# Patient Record
Sex: Female | Born: 1980
Health system: Southern US, Community
[De-identification: ages and names within clinical notes are randomized; demographics above are authoritative.]

## PROBLEM LIST (undated history)

## (undated) DIAGNOSIS — N941 Unspecified dyspareunia: Secondary | ICD-10-CM

## (undated) DIAGNOSIS — D1720 Benign lipomatous neoplasm of skin and subcutaneous tissue of unspecified limb: Secondary | ICD-10-CM

## (undated) DIAGNOSIS — D179 Benign lipomatous neoplasm, unspecified: Secondary | ICD-10-CM

## (undated) DIAGNOSIS — Q513 Bicornate uterus: Secondary | ICD-10-CM

## (undated) HISTORY — DX: Unspecified dyspareunia: N94.10

## (undated) HISTORY — PX: NO PAST SURGERIES: SHX2092

## (undated) HISTORY — DX: Bicornate uterus: Q51.3

---

## 2010-10-27 ENCOUNTER — Emergency Department (HOSPITAL_COMMUNITY)
Admission: EM | Admit: 2010-10-27 | Discharge: 2010-10-27 | Payer: Self-pay | Source: Home / Self Care | Admitting: Emergency Medicine

## 2011-03-06 ENCOUNTER — Inpatient Hospital Stay (HOSPITAL_COMMUNITY)
Admission: AD | Admit: 2011-03-06 | Discharge: 2011-03-06 | Disposition: A | Discharge status: Home / Self Care | Payer: Medicaid Other | Source: Ambulatory Visit | Attending: Family Medicine | Admitting: Family Medicine

## 2011-03-06 ENCOUNTER — Inpatient Hospital Stay (HOSPITAL_COMMUNITY): Payer: Self-pay

## 2011-03-06 ENCOUNTER — Inpatient Hospital Stay (HOSPITAL_COMMUNITY): Payer: Medicaid Other

## 2011-03-06 DIAGNOSIS — R109 Unspecified abdominal pain: Secondary | ICD-10-CM

## 2011-03-06 DIAGNOSIS — O9989 Other specified diseases and conditions complicating pregnancy, childbirth and the puerperium: Secondary | ICD-10-CM

## 2011-03-06 DIAGNOSIS — R102 Pelvic and perineal pain: Secondary | ICD-10-CM

## 2011-03-06 DIAGNOSIS — O99891 Other specified diseases and conditions complicating pregnancy: Secondary | ICD-10-CM | POA: Insufficient documentation

## 2011-03-06 LAB — URINALYSIS, ROUTINE W REFLEX MICROSCOPIC
Bilirubin Urine: NEGATIVE
Glucose, UA: NEGATIVE mg/dL
Hgb urine dipstick: NEGATIVE
Ketones, ur: 15 mg/dL — AB
pH: 6 (ref 5.0–8.0)

## 2011-03-06 LAB — HCG, QUANTITATIVE, PREGNANCY: hCG, Beta Chain, Quant, S: 12864 m[IU]/mL — ABNORMAL HIGH (ref <5)

## 2011-03-06 LAB — CBC
Hemoglobin: 13 g/dL (ref 12.0–15.0)
MCHC: 34.2 g/dL (ref 30.0–36.0)
WBC: 11.3 10*3/uL — ABNORMAL HIGH (ref 4.0–10.5)

## 2011-03-06 LAB — WET PREP, GENITAL: Clue Cells Wet Prep HPF POC: NONE SEEN

## 2011-03-07 LAB — GC/CHLAMYDIA PROBE AMP, GENITAL
Chlamydia, DNA Probe: UNDETERMINED
GC Probe Amp, Genital: NEGATIVE

## 2011-03-10 ENCOUNTER — Other Ambulatory Visit: Payer: Self-pay | Admitting: Family Medicine

## 2011-03-10 DIAGNOSIS — O009 Unspecified ectopic pregnancy without intrauterine pregnancy: Secondary | ICD-10-CM

## 2011-03-13 ENCOUNTER — Inpatient Hospital Stay (HOSPITAL_COMMUNITY)
Admission: AD | Admit: 2011-03-13 | Discharge: 2011-03-13 | Disposition: A | Discharge status: Home / Self Care | Payer: Medicaid Other | Source: Ambulatory Visit | Attending: Obstetrics and Gynecology | Admitting: Obstetrics and Gynecology

## 2011-03-13 ENCOUNTER — Ambulatory Visit (HOSPITAL_COMMUNITY)
Admission: RE | Admit: 2011-03-13 | Discharge: 2011-03-13 | Disposition: A | Discharge status: Home / Self Care | Payer: Medicaid Other | Source: Ambulatory Visit | Attending: Family Medicine | Admitting: Family Medicine

## 2011-03-13 DIAGNOSIS — R109 Unspecified abdominal pain: Secondary | ICD-10-CM | POA: Insufficient documentation

## 2011-03-13 DIAGNOSIS — O209 Hemorrhage in early pregnancy, unspecified: Secondary | ICD-10-CM | POA: Insufficient documentation

## 2011-03-13 DIAGNOSIS — O009 Unspecified ectopic pregnancy without intrauterine pregnancy: Secondary | ICD-10-CM

## 2011-03-13 DIAGNOSIS — O99891 Other specified diseases and conditions complicating pregnancy: Secondary | ICD-10-CM | POA: Insufficient documentation

## 2011-03-31 ENCOUNTER — Inpatient Hospital Stay (HOSPITAL_COMMUNITY): Payer: Medicaid Other

## 2011-03-31 ENCOUNTER — Inpatient Hospital Stay (HOSPITAL_COMMUNITY)
Admission: AD | Admit: 2011-03-31 | Discharge: 2011-03-31 | Disposition: A | Discharge status: Home / Self Care | Payer: Medicaid Other | Source: Ambulatory Visit | Attending: Obstetrics and Gynecology | Admitting: Obstetrics and Gynecology

## 2011-03-31 DIAGNOSIS — O021 Missed abortion: Secondary | ICD-10-CM | POA: Insufficient documentation

## 2011-03-31 LAB — CBC
HCT: 38.4 % (ref 36.0–46.0)
Hemoglobin: 12.8 g/dL (ref 12.0–15.0)
MCH: 29.4 pg (ref 26.0–34.0)
MCHC: 33.3 g/dL (ref 30.0–36.0)

## 2011-03-31 LAB — URINALYSIS, ROUTINE W REFLEX MICROSCOPIC
Bilirubin Urine: NEGATIVE
Protein, ur: NEGATIVE mg/dL
Urobilinogen, UA: 0.2 mg/dL (ref 0.0–1.0)

## 2011-08-12 LAB — RUBELLA ANTIBODY, IGM: Rubella: IMMUNE

## 2011-08-12 LAB — HEPATITIS B SURFACE ANTIGEN: Hepatitis B Surface Ag: NEGATIVE

## 2011-08-12 LAB — GC/CHLAMYDIA PROBE AMP, GENITAL
Chlamydia: NEGATIVE
Gonorrhea: NEGATIVE

## 2011-08-12 LAB — ABO/RH: RH Type: POSITIVE

## 2011-10-11 ENCOUNTER — Emergency Department (HOSPITAL_COMMUNITY)
Admission: EM | Admit: 2011-10-11 | Discharge: 2011-10-11 | Disposition: A | Discharge status: Home / Self Care | Payer: Medicaid Other | Attending: Emergency Medicine | Admitting: Emergency Medicine

## 2011-10-11 DIAGNOSIS — R05 Cough: Secondary | ICD-10-CM | POA: Insufficient documentation

## 2011-10-11 DIAGNOSIS — J3489 Other specified disorders of nose and nasal sinuses: Secondary | ICD-10-CM | POA: Insufficient documentation

## 2011-10-11 DIAGNOSIS — H9209 Otalgia, unspecified ear: Secondary | ICD-10-CM | POA: Insufficient documentation

## 2011-10-11 DIAGNOSIS — R070 Pain in throat: Secondary | ICD-10-CM | POA: Insufficient documentation

## 2011-10-11 DIAGNOSIS — H6120 Impacted cerumen, unspecified ear: Secondary | ICD-10-CM | POA: Insufficient documentation

## 2011-10-11 DIAGNOSIS — R059 Cough, unspecified: Secondary | ICD-10-CM | POA: Insufficient documentation

## 2011-10-11 DIAGNOSIS — J069 Acute upper respiratory infection, unspecified: Secondary | ICD-10-CM | POA: Insufficient documentation

## 2011-10-11 DIAGNOSIS — R51 Headache: Secondary | ICD-10-CM | POA: Insufficient documentation

## 2012-01-26 LAB — STREP B DNA PROBE: GBS: NEGATIVE

## 2012-02-17 ENCOUNTER — Telehealth (HOSPITAL_COMMUNITY): Payer: Self-pay | Admitting: *Deleted

## 2012-02-17 ENCOUNTER — Encounter (HOSPITAL_COMMUNITY): Payer: Self-pay | Admitting: *Deleted

## 2012-02-17 NOTE — Telephone Encounter (Signed)
Preadmission screen  

## 2012-02-20 ENCOUNTER — Inpatient Hospital Stay (HOSPITAL_COMMUNITY): Admission: RE | Admit: 2012-02-20 | Payer: Medicaid Other | Source: Ambulatory Visit

## 2012-02-24 ENCOUNTER — Inpatient Hospital Stay (HOSPITAL_COMMUNITY): Payer: Medicaid Other | Admitting: Anesthesiology

## 2012-02-24 ENCOUNTER — Encounter (HOSPITAL_COMMUNITY): Payer: Self-pay

## 2012-02-24 ENCOUNTER — Encounter (HOSPITAL_COMMUNITY): Payer: Self-pay | Admitting: Anesthesiology

## 2012-02-24 ENCOUNTER — Inpatient Hospital Stay (HOSPITAL_COMMUNITY)
Admission: RE | Admit: 2012-02-24 | Discharge: 2012-02-26 | DRG: 774 | Disposition: A | Discharge status: Hospice | Payer: Medicaid Other | Source: Ambulatory Visit | Attending: Obstetrics and Gynecology | Admitting: Obstetrics and Gynecology

## 2012-02-24 LAB — CBC
Hemoglobin: 13.1 g/dL (ref 12.0–15.0)
MCH: 29.9 pg (ref 26.0–34.0)
MCV: 89 fL (ref 78.0–100.0)
RBC: 4.38 MIL/uL (ref 3.87–5.11)
WBC: 12.7 10*3/uL — ABNORMAL HIGH (ref 4.0–10.5)

## 2012-02-24 MED ORDER — LACTATED RINGERS IV SOLN
INTRAVENOUS | Status: DC
  Administered 2012-02-24 (×4): via INTRAVENOUS

## 2012-02-24 MED ORDER — TERBUTALINE SULFATE 1 MG/ML IJ SOLN: 0.2500 mg | Freq: Once | INTRAMUSCULAR | Status: AC | PRN

## 2012-02-24 MED ORDER — OXYTOCIN 20 UNITS IN LACTATED RINGERS INFUSION - SIMPLE
1.0000 m[IU]/min | INTRAVENOUS | Status: DC
  Administered 2012-02-24: 1 m[IU]/min via INTRAVENOUS
  Filled 2012-02-24: qty 1000

## 2012-02-24 MED ORDER — OXYCODONE-ACETAMINOPHEN 5-325 MG PO TABS
1.0000 | ORAL_TABLET | ORAL | Status: DC | PRN
  Administered 2012-02-25: 1 via ORAL
  Filled 2012-02-24: qty 1

## 2012-02-24 MED ORDER — PHENYLEPHRINE 40 MCG/ML (10ML) SYRINGE FOR IV PUSH (FOR BLOOD PRESSURE SUPPORT): 80.0000 ug | PREFILLED_SYRINGE | INTRAVENOUS | Status: DC | PRN

## 2012-02-24 MED ORDER — OXYTOCIN BOLUS FROM INFUSION
500.0000 mL | Freq: Once | INTRAVENOUS | Status: DC
  Filled 2012-02-24: qty 500
  Filled 2012-02-24: qty 1000

## 2012-02-24 MED ORDER — LIDOCAINE HCL (PF) 1 % IJ SOLN
INTRAMUSCULAR | Status: DC | PRN
  Administered 2012-02-24 (×2): 5 mL

## 2012-02-24 MED ORDER — LACTATED RINGERS IV SOLN
500.0000 mL | INTRAVENOUS | Status: DC | PRN
  Administered 2012-02-24 (×2): 500 mL via INTRAVENOUS

## 2012-02-24 MED ORDER — FENTANYL 2.5 MCG/ML BUPIVACAINE 1/10 % EPIDURAL INFUSION (WH - ANES)
14.0000 mL/h | INTRAMUSCULAR | Status: DC
  Administered 2012-02-24 (×2): 14 mL/h via EPIDURAL
  Filled 2012-02-24 (×2): qty 60

## 2012-02-24 MED ORDER — EPHEDRINE 5 MG/ML INJ
10.0000 mg | INTRAVENOUS | Status: DC | PRN
  Filled 2012-02-24: qty 4

## 2012-02-24 MED ORDER — DIPHENHYDRAMINE HCL 50 MG/ML IJ SOLN: 12.5000 mg | INTRAMUSCULAR | Status: DC | PRN

## 2012-02-24 MED ORDER — EPHEDRINE 5 MG/ML INJ: 10.0000 mg | INTRAVENOUS | Status: DC | PRN

## 2012-02-24 MED ORDER — LACTATED RINGERS IV SOLN
500.0000 mL | Freq: Once | INTRAVENOUS | Status: AC
  Administered 2012-02-24: 500 mL via INTRAVENOUS

## 2012-02-24 MED ORDER — LIDOCAINE HCL (PF) 1 % IJ SOLN
30.0000 mL | INTRAMUSCULAR | Status: DC | PRN
  Administered 2012-02-24: 30 mL via SUBCUTANEOUS
  Filled 2012-02-24: qty 30

## 2012-02-24 MED ORDER — IBUPROFEN 600 MG PO TABS
600.0000 mg | ORAL_TABLET | Freq: Four times a day (QID) | ORAL | Status: DC | PRN
  Administered 2012-02-25: 600 mg via ORAL
  Filled 2012-02-24: qty 1

## 2012-02-24 MED ORDER — PHENYLEPHRINE 40 MCG/ML (10ML) SYRINGE FOR IV PUSH (FOR BLOOD PRESSURE SUPPORT)
80.0000 ug | PREFILLED_SYRINGE | INTRAVENOUS | Status: DC | PRN
  Filled 2012-02-24: qty 5

## 2012-02-24 MED ORDER — OXYTOCIN 20 UNITS IN LACTATED RINGERS INFUSION - SIMPLE: 125.0000 mL/h | Freq: Once | INTRAVENOUS | Status: DC

## 2012-02-24 NOTE — Progress Notes (Signed)
RN called Dr. Ambrose Mantle, reported 2 late decels, and interventions that had taken place (turned pt to left side, 500cc bolus, and 10L oxygen via non-rebreather. RN also told Dr. Ambrose Mantle that contractions were every 3-5 minutes. Dr. Ambrose Mantle instructed to continue with pitocin, and continue changing position of patient, to maintain reassuring fetal pattern.

## 2012-02-24 NOTE — Anesthesia Preprocedure Evaluation (Signed)

## 2012-02-24 NOTE — Progress Notes (Signed)
Patient ID: Haley Frazier, female   DOB: 1981-02-19, 31 y.o.   MRN: [id number] Pt is having more bloody show and the cervix is 10 cm 100% effaced and the vertex is at +1 station. We will have her begin pushing.

## 2012-02-24 NOTE — H&P (Signed)
Haley Frazier, [place]                 ACCOUNT NO.:  [id number]  MEDICAL RECORD NO.:  [id number]  LOCATION:  9173                          FACILITY:  [facility]  PHYSICIAN:  Malachi Pro. Ambrose Mantle, M.D. DATE OF BIRTH:  [birth year]  DATE OF ADMISSION:  02/24/2012 DATE OF DISCHARGE:                             HISTORY & PHYSICAL   PRESENT ILLNESS:  This is a 31 year old Asian female, para 0-0-1-0, gravida 2, EDC February 18, 2012, by an ultrasound at 10 weeks and 0 days done on July 23, 2011, is admitted for her induction of labor.  LABORATORY DATA:  Blood group and type O positive with a negative antibody, rubella immune, RPR nonreactive.  Urine culture negative. Hepatitis B surface antigen negative.  HIV negative.  First trimester screening normal.  GC and Chlamydia negative.  Cystic fibrosis negative. AFP negative.  One hour Glucola 135, 3-hour GTT 101, 116, 113, and 106. Repeat HIV and RPR negative.  Group B strep negative.  This patient began her prenatal course with early ultrasound at 10 weeks and corrected her EDC.  She is having uncomplicated prenatal course. However, she has gone 6 days past her due date without going into labor. Her cervix is reasonably favorable, and she is admitted for induction of labor.  PAST MEDICAL HISTORY:  No known.  ALLERGIES:  To drugs, no latex allergy.  She has had no medical illnesses and no surgical procedures.  FAMILY HISTORY:  Mother has high blood pressure.  The patient did have a spontaneous abortion in 2012.  She does not drink, smoke, or take illicit drugs.  She is single but in a relationship.  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  PHYSICAL EXAMINATION:  VITAL SIGNS:  On admission, blood pressure 119/82, temperature 97.6, respirations 16, pulse is 80. HEAD, EYES, NOSE, AND THROAT:  Normal. HEART:  Normal size and sounds.  No murmurs. LUNGS:  Clear to auscultation. ABDOMEN:  Soft.  Fundal height at the office on February 23, 2012, was  37 cm.  Fetal heart tones are normal.  In the office on February 23, 2012, nonstress test was nonreactive and a biophysical profile was 8/8.  The cervix on February 23, 2012, was 2 cm, 50% effaced, vertex was at a -2 station.  ADMITTING IMPRESSION:  Intrauterine pregnancy at 40 weeks and 6 days. She is admitted for Pitocin induction of labor.     Malachi Pro. Ambrose Mantle, M.D.     TFH/MEDQ  D:  02/24/2012  T:  02/24/2012  Job:  [id number]

## 2012-02-24 NOTE — Progress Notes (Signed)
Patient ID: Haley Frazier, female   DOB: 10-04-1981, 31 y.o.   MRN: [id number] Pt reached 4 cm and received an epidural and at 4:15 was 8 cm and now per the RN exam she is 9 cm 100% effaced and the vertex is at -1 station.

## 2012-02-24 NOTE — Anesthesia Procedure Notes (Signed)
Epidural Patient location during procedure: OB Start time: 02/24/2012 3:26 PM  Staffing Anesthesiologist: Brayton Caves R Performed by: anesthesiologist   Preanesthetic Checklist Completed: patient identified, site marked, surgical consent, pre-op evaluation, timeout performed, IV checked, risks and benefits discussed and monitors and equipment checked  Epidural Patient position: sitting Prep: site prepped and draped and DuraPrep Patient monitoring: continuous pulse ox and blood pressure Approach: midline Injection technique: LOR air and LOR saline  Needle:  Needle type: Tuohy  Needle gauge: 17 G Needle length: 9 cm Needle insertion depth: 4 cm Catheter type: closed end flexible Catheter size: 19 Gauge Catheter at skin depth: 10 cm Test dose: negative  Assessment Events: blood not aspirated, injection not painful, no injection resistance, negative IV test and no paresthesia  Additional Notes Patient identified.  Risk benefits discussed including failed block, incomplete pain control, headache, nerve damage, paralysis, blood pressure changes, nausea, vomiting, reactions to medication both toxic or allergic, and postpartum back pain.  Patient expressed understanding and wished to proceed.  All questions were answered.  Sterile technique used throughout procedure and epidural site dressed with sterile barrier dressing. No paresthesia or other complications noted.The patient did not experience any signs of intravascular injection such as tinnitus or metallic taste in mouth nor signs of intrathecal spread such as rapid motor block. Please see nursing notes for vital signs.

## 2012-02-24 NOTE — Progress Notes (Signed)
Dr. Ambrose Mantle reviewed fetal heart rate strip at this time, and stated that variability was minimal yesterday in the office.

## 2012-02-24 NOTE — Progress Notes (Signed)
RN called Ambrose Mantle, MD and updated him on status, UC's every 2-32min, accels, early decels, and cervix 8cm dilated with 100% effacement, -1 station.

## 2012-02-24 NOTE — Progress Notes (Signed)
Patient ID: Haley Frazier, female   DOB: 1980/12/24, 31 y.o.   MRN: [id number] Delivery note:   I prepped the pt for delivery and injected 12 cc's of 1% xylocaine for  a LML episiotomy. With 1 push the pt made no more descent so a Mity Vac bell cup vacuum was placed and with 1 contraction and no pop off's a living female infant 7 pounds 6 ounces was delivered LOA. Apgars were 9 and 9 at 1 and 5 minutes. The placenta delivered intact and the uterus was normal but boggy so manual massage was done to restore tone. The LML episiotomy was repaired with 2-0 and 3-0 vicryl. Rectal before the repair confirmed no rectal injury. EBL 600 cc's.

## 2012-02-24 NOTE — Progress Notes (Signed)
Patient ID: Haley Frazier, female   DOB: [birth year], 31 y.o.   MRN: [id number] Pitocin at 9 mu/ minute and contractions q 2-3 minutes but pt has no pain with the contractions. AROM produced clear fluid.

## 2012-02-24 NOTE — Progress Notes (Signed)
Patient ID: Haley Frazier, female   DOB: 06/27/1981, 31 y.o.   MRN: [id number] Pitocin at 11 mu/ minute and the contractions are q 2-3 minutes. The cervix is 3 cm 70 % effaced and the vertex is still at -2 station.

## 2012-02-24 NOTE — Progress Notes (Signed)
Patient ID: Haley Frazier, female   DOB: [birth year], 31 y.o.   MRN: [id number] The pt became fully dilated at 7 :30 PM but because of extra duties by the RN the pt did not begin pushing until 8:25 PM. She has pushed well but her first push is always her best push and she seems to fatigue easily. There is significant caput showing so I advised her that at 11 PM we should try to get the baby delivered by an episiotomy and if not effective a vacuum. Her temp is 101.0 She and her husband agree.

## 2012-02-25 LAB — DIFFERENTIAL
Eosinophils Absolute: 0.1 10*3/uL (ref 0.0–0.7)
Eosinophils Relative: 0 % (ref 0–5)
Lymphs Abs: 1.1 10*3/uL (ref 0.7–4.0)
Monocytes Relative: 6 % (ref 3–12)

## 2012-02-25 LAB — CBC
MCH: 30.1 pg (ref 26.0–34.0)
MCV: 89.5 fL (ref 78.0–100.0)
Platelets: 171 10*3/uL (ref 150–400)
RBC: 3.42 MIL/uL — ABNORMAL LOW (ref 3.87–5.11)

## 2012-02-25 MED ORDER — SENNOSIDES-DOCUSATE SODIUM 8.6-50 MG PO TABS
2.0000 | ORAL_TABLET | Freq: Every day | ORAL | Status: DC
  Administered 2012-02-25: 2 via ORAL

## 2012-02-25 MED ORDER — IBUPROFEN 600 MG PO TABS
600.0000 mg | ORAL_TABLET | Freq: Four times a day (QID) | ORAL | Status: DC
  Administered 2012-02-25 – 2012-02-26 (×5): 600 mg via ORAL
  Filled 2012-02-25 (×5): qty 1

## 2012-02-25 MED ORDER — DIPHENHYDRAMINE HCL 25 MG PO CAPS: 25.0000 mg | ORAL_CAPSULE | Freq: Four times a day (QID) | ORAL | Status: DC | PRN

## 2012-02-25 MED ORDER — PRENATAL MULTIVITAMIN CH: 1.0000 | ORAL_TABLET | Freq: Every day | ORAL | Status: DC

## 2012-02-25 MED ORDER — BENZOCAINE-MENTHOL 20-0.5 % EX AERO: 1.0000 "application " | INHALATION_SPRAY | CUTANEOUS | Status: DC | PRN

## 2012-02-25 MED ORDER — OXYTOCIN 20 UNITS IN LACTATED RINGERS INFUSION - SIMPLE: 125.0000 mL/h | INTRAVENOUS | Status: AC

## 2012-02-25 MED ORDER — BENZOCAINE-MENTHOL 20-0.5 % EX AERO
INHALATION_SPRAY | CUTANEOUS | Status: AC
  Filled 2012-02-25: qty 56

## 2012-02-25 MED ORDER — ZOLPIDEM TARTRATE 5 MG PO TABS: 5.0000 mg | ORAL_TABLET | Freq: Every evening | ORAL | Status: DC | PRN

## 2012-02-25 MED ORDER — PRENATAL MULTIVITAMIN CH
1.0000 | ORAL_TABLET | Freq: Every day | ORAL | Status: DC
  Administered 2012-02-25 – 2012-02-26 (×2): 1 via ORAL
  Filled 2012-02-25 (×2): qty 1

## 2012-02-25 MED ORDER — ONDANSETRON HCL 4 MG PO TABS: 4.0000 mg | ORAL_TABLET | ORAL | Status: DC | PRN

## 2012-02-25 MED ORDER — ONDANSETRON HCL 4 MG/2ML IJ SOLN
4.0000 mg | INTRAMUSCULAR | Status: DC | PRN
  Administered 2012-02-25: 4 mg via INTRAVENOUS
  Filled 2012-02-25: qty 2

## 2012-02-25 MED ORDER — DIBUCAINE 1 % RE OINT: 1.0000 "application " | TOPICAL_OINTMENT | RECTAL | Status: DC | PRN

## 2012-02-25 MED ORDER — TETANUS-DIPHTH-ACELL PERTUSSIS 5-2.5-18.5 LF-MCG/0.5 IM SUSP
0.5000 mL | Freq: Once | INTRAMUSCULAR | Status: AC
  Administered 2012-02-25: 0.5 mL via INTRAMUSCULAR
  Filled 2012-02-25: qty 0.5

## 2012-02-25 MED ORDER — WITCH HAZEL-GLYCERIN EX PADS: 1.0000 "application " | MEDICATED_PAD | CUTANEOUS | Status: DC | PRN

## 2012-02-25 MED ORDER — SIMETHICONE 80 MG PO CHEW: 80.0000 mg | CHEWABLE_TABLET | ORAL | Status: DC | PRN

## 2012-02-25 MED ORDER — MEASLES, MUMPS & RUBELLA VAC ~~LOC~~ INJ
0.5000 mL | INJECTION | Freq: Once | SUBCUTANEOUS | Status: DC
  Filled 2012-02-25: qty 0.5

## 2012-02-25 MED ORDER — LANOLIN HYDROUS EX OINT: TOPICAL_OINTMENT | CUTANEOUS | Status: DC | PRN

## 2012-02-25 MED ORDER — OXYCODONE-ACETAMINOPHEN 5-325 MG PO TABS
1.0000 | ORAL_TABLET | ORAL | Status: DC | PRN
  Administered 2012-02-26: 1 via ORAL
  Filled 2012-02-25: qty 1

## 2012-02-25 NOTE — Progress Notes (Signed)
UR chart review completed.  

## 2012-02-25 NOTE — Progress Notes (Signed)
Patient ID: Haley Frazier, female   DOB: June 04, 1981, 31 y.o.   MRN: [id number] #1 afebrile this AM Had temp to 101 prior to delivery. No foreign body in the vagina.

## 2012-02-25 NOTE — Anesthesia Postprocedure Evaluation (Signed)
  Anesthesia Post-op Note  Patient: Haley Frazier  Procedure(s) Performed: * No procedures listed *  Patient Location: Mother/Baby  Anesthesia Type: Epidural  Level of Consciousness: awake, alert  and oriented  Airway and Oxygen Therapy: Patient Spontanous Breathing  Post-op Pain: mild  Post-op Assessment: Patient's Cardiovascular Status Stable and Respiratory Function Stable  Post-op Vital Signs: stable  Complications: No apparent anesthesia complications

## 2012-02-25 NOTE — Procedures (Signed)
Dr. Ambrose Frazier here for brief post-partum vag. Exam. Pt. Prepped and positioned. Tolerated procedure well

## 2012-02-26 MED ORDER — IBUPROFEN 600 MG PO TABS: 600.0000 mg | ORAL_TABLET | Freq: Four times a day (QID) | ORAL | Status: AC | PRN

## 2012-02-26 MED ORDER — OXYCODONE-ACETAMINOPHEN 5-325 MG PO TABS: 1.0000 | ORAL_TABLET | Freq: Four times a day (QID) | ORAL | Status: AC | PRN

## 2012-02-26 NOTE — Discharge Summary (Signed)
Haley Frazier, Haley Frazier                 ACCOUNT NO.:  [id number]  MEDICAL RECORD NO.:  [id number]  LOCATION:  9113                          FACILITY:  [facility]  PHYSICIAN:  Malachi Pro. Ambrose Mantle, M.D. DATE OF BIRTH:  07/11/1981  DATE OF ADMISSION:  02/24/2012 DATE OF DISCHARGE:  02/26/2012                              DISCHARGE SUMMARY   HISTORY OF PRESENT ILLNESS:  This is a 31 year old Asian female, para 0- 0-1-0, gravida 2, EDC February 18, 2012, by ultrasound at 10 weeks and 0 days, done on July 23, 2011, admitted for induction of labor.  Blood group and type O positive.  Negative antibody.  Rubella immune.  RPR nonreactive. Urine culture negative.  Hepatitis B surface antigen negative.  HIV negative.  First trimester screen normal.  GC and Chlamydia negative.  Cystic fibrosis negative.  AFP negative.  One hour Glucola 135, 3 hour GTT 101, 116, 113, and 106.  Repeat HIV and RPR negative.  Group B strep negative.  The patient was admitted and placed on Pitocin by 12:28 p.m.  On the day of admission, her Pitocin was at 9 milliunits a minute, contractions every 2-3 minutes, but there was no pain.  Artificial rupture of the membranes produced clear fluid.  By 1:19 p.m., the Pitocin was 11 milliunits a minute.  Contractions 2-3 minutes apart.  Cervix 3 cm, 70% vertex still at -2.  The patient was thought to reach 4 cm by the nurse and received an epidural.  At 4:15 p.m., she was 8 cm dilated and at 5:43 p.m. 9 cm.  The patient was noted to be fully dilated at 7:30 p.m. but because of extra duties by the RN, she did not began pushing until 8:25 p.m.  During pushing, her first push was always the best.  I advised the patient at  10:49 p.m. that at 11:00 p.m. we should try to get the baby delivered.  She developed a temperature elevation to 101 degrees.  I placed Xylocaine in the area for left mediolateral episiotomy, observed the patient pushed through 1 contraction, there was no more descent, there  was already some kaput showing and a Mityvac Bell cup vacuum was placed and with 1 contraction and no pop offs, a living female infant 7 pounds 6 ounces was delivered LOA.  Apgars were 9 and 9 at 1 and 5 minutes.  Placenta was intact. Uterus was boggy.  Massage and Pitocin were used.  Left mediolateral episiotomy repaired with 2-0 and 3-0 Vicryl.  Rectal done before the repair confirmed no rectal injury.  Blood loss about 600 mL. Postpartum, the patient did well.  She never became febrile postpartum and on the second postpartum day was ready for discharge.  LABORATORY DATA:  Initial hemoglobin of 13.1, hematocrit 39, white count 12,700, platelet count 227,000.  Followup hemoglobin 10.3, hematocrit 30.6.  FINAL DIAGNOSES:  Intrauterine pregnancy at 41 weeks, delivered vertex, prolonged second stage labor.  OPERATION:  Vacuum-assisted vaginal delivery.  Left mediolateral episiotomy and repair.  FINAL CONDITION:  Improved.  INSTRUCTIONS:  Our regular discharge instruction booklet.  The patient is advised to return to the office in 6 weeks for followup examination. They  are going to have the baby circumcised at an outside office, Percocet 5/325, and Motrin 600 mg, 30 of each 1 every 6 hours as needed for pain given at discharge.     Malachi Pro. Ambrose Mantle, M.D.     TFH/MEDQ  D:  02/26/2012  T:  02/26/2012  Job:  [id number]

## 2012-02-26 NOTE — Discharge Instructions (Signed)
booklet °

## 2012-02-26 NOTE — Discharge Summary (Signed)
#  2 afebrile no problems for d/c.

## 2012-03-15 ENCOUNTER — Emergency Department (INDEPENDENT_AMBULATORY_CARE_PROVIDER_SITE_OTHER)
Admission: EM | Admit: 2012-03-15 | Discharge: 2012-03-15 | Disposition: A | Discharge status: Home / Self Care | Payer: Medicaid Other | Source: Home / Self Care | Attending: Family Medicine | Admitting: Family Medicine

## 2012-03-15 ENCOUNTER — Encounter (HOSPITAL_COMMUNITY): Payer: Self-pay

## 2012-03-15 DIAGNOSIS — R1033 Periumbilical pain: Secondary | ICD-10-CM

## 2012-03-15 MED ORDER — IBUPROFEN 600 MG PO TABS: 600.0000 mg | ORAL_TABLET | Freq: Three times a day (TID) | ORAL | Status: AC | PRN

## 2012-03-15 MED ORDER — HYDROCODONE-ACETAMINOPHEN 5-500 MG PO TABS: 1.0000 | ORAL_TABLET | Freq: Four times a day (QID) | ORAL | Status: AC | PRN

## 2012-03-15 NOTE — ED Provider Notes (Signed)
History     CSN: [id number]  Arrival date & time 03/15/12  1904   First MD Initiated Contact with Patient 03/15/12 1921      Chief Complaint  Patient presents with  . Abdominal Pain    (Consider location/radiation/quality/duration/timing/severity/associated sxs/prior treatment) HPI Comments: 31 y/o female 3 weeks post partum by Vacuum assisted VD at >40 weeks during induction. Here c/o intermittent periumbilical abdominal pain. Pain comes and goes daily since her discharge from delivery. Worse during breast feeding. Taking ibuprofen sporadically which helps. No irradiation of pain. Does no relate pain with food intake. Denies nausea, vomiting or diarrhea, denies fever or chills. Denies dysuria. Reports still minimal vaginal spotting. Denies vaginal discharge or bad odor. Last bowel movement in am formed stools, no straining. Good appetite. No jaundice, no headache or dizziness. Has not taken any medication today.   Past Medical History  Diagnosis Date  . No pertinent past medical history     Past Surgical History  Procedure Date  . No past surgeries     Family History  Problem Relation Age of Onset  . Hypertension Mother     History  Substance Use Topics  . Smoking status: Never Smoker   . Smokeless tobacco: Never Used  . Alcohol Use: No    OB History    Grav Para Term Preterm Abortions TAB SAB Ect Mult Living   2 1 1  1  1   1       Review of Systems  Constitutional: Negative for fever, chills, appetite change and fatigue.  HENT: Negative for congestion, sore throat and trouble swallowing.   Respiratory: Negative for cough and shortness of breath.   Cardiovascular: Negative for leg swelling.  Gastrointestinal: Positive for abdominal pain. Negative for nausea, vomiting, diarrhea, constipation, blood in stool, abdominal distention and rectal pain.  Genitourinary: Negative for dysuria, frequency, flank pain, vaginal discharge and pelvic pain.  Musculoskeletal:  Negative for back pain.  Skin: Negative for rash.  Neurological: Negative for dizziness and headaches.    Allergies  Review of patient's allergies indicates no known allergies.  Home Medications   Current Outpatient Rx  Name Route Sig Dispense Refill  . PRENATAL MULTIVITAMIN CH Oral Take 1 tablet by mouth daily.    Marland Kitchen HYDROCODONE-ACETAMINOPHEN 5-500 MG PO TABS Oral Take 1-2 tablets by mouth every 6 (six) hours as needed for pain. 15 tablet 0  . IBUPROFEN 600 MG PO TABS Oral Take 1 tablet (600 mg total) by mouth every 8 (eight) hours as needed for pain. 20 tablet 0    BP 117/78  Pulse 85  Temp(Src) 98.6 F (37 C) (Oral)  Resp 18  SpO2 99%  LMP 04/22/2011  Physical Exam  Nursing note and vitals reviewed. Constitutional: She is oriented to person, place, and time. She appears well-developed and well-nourished. No distress.       Appears comfortable  HENT:  Head: Normocephalic and atraumatic.  Mouth/Throat: Oropharynx is clear and moist. No oropharyngeal exudate.  Eyes: Conjunctivae are normal. Pupils are equal, round, and reactive to light. No scleral icterus.  Neck: Normal range of motion. Neck supple. No thyromegaly present.  Cardiovascular: Normal rate, regular rhythm and normal heart sounds.        No LEE.  Pulmonary/Chest: Breath sounds normal.  Abdominal: Soft. Bowel sounds are normal. She exhibits no distension and no mass. There is no rebound and no guarding.       No hepatosplenomegaly. No CVT  Mild tenderness to deep palpation  in left periumbilical area, uterus fundus below synphysis. No rebound or guarding.  Lymphadenopathy:    She has no cervical adenopathy.  Neurological: She is alert and oriented to person, place, and time.  Skin: Skin is warm. No rash noted.    ED Course  Procedures (including critical care time)  Labs Reviewed - No data to display No results found.   1. Abdominal pain, periumbilical       MDM  Reassuring abdominal exam. No  dyspepsia.  Impress uterine cramping. Prescribed Vicodin, ibuprofen, stool softner. Asked to follow up with GYN as scheduled or go to ED if worsening or new symptoms like fever, chills, nausea or vomiting.         Sharin Grave, MD 03/16/12 1116

## 2012-03-15 NOTE — Discharge Instructions (Signed)
Take the prescribed medications as instructed. Be aware that Vicodin can cause drowsiness and should not be taking before driving it can also cause constipation. You can take MiraLax over-the-counter one cup full mix with water or Gatorade while taking Vicodin for pain to avoid constipation. [facility] persistent or worsening pain or new symptoms like nausea, vomiting, burning or urination, increased vaginal bleeding or discharge or fever.

## 2012-03-15 NOTE — ED Notes (Signed)
C/o 2-3 week duration of epigastric pain w increased need to "burp'; no vomiting , diarrhea

## 2013-03-01 ENCOUNTER — Emergency Department (INDEPENDENT_AMBULATORY_CARE_PROVIDER_SITE_OTHER)
Admission: EM | Admit: 2013-03-01 | Discharge: 2013-03-01 | Disposition: A | Discharge status: Home / Self Care | Payer: Self-pay | Source: Home / Self Care | Attending: Family Medicine | Admitting: Family Medicine

## 2013-03-01 ENCOUNTER — Encounter (HOSPITAL_COMMUNITY): Payer: Self-pay | Admitting: *Deleted

## 2013-03-01 DIAGNOSIS — K5289 Other specified noninfective gastroenteritis and colitis: Secondary | ICD-10-CM

## 2013-03-01 DIAGNOSIS — K529 Noninfective gastroenteritis and colitis, unspecified: Secondary | ICD-10-CM

## 2013-03-01 MED ORDER — ONDANSETRON HCL 4 MG/2ML IJ SOLN
INTRAMUSCULAR | Status: AC
  Filled 2013-03-01: qty 2

## 2013-03-01 MED ORDER — ONDANSETRON HCL 4 MG PO TABS: 4.0000 mg | ORAL_TABLET | Freq: Four times a day (QID) | ORAL | Status: DC

## 2013-03-01 MED ORDER — ONDANSETRON HCL 4 MG/2ML IJ SOLN
4.0000 mg | Freq: Once | INTRAMUSCULAR | Status: AC
  Administered 2013-03-01: 4 mg via INTRAVENOUS

## 2013-03-01 MED ORDER — SODIUM CHLORIDE 0.9 % IV SOLN
Freq: Once | INTRAVENOUS | Status: AC
  Administered 2013-03-01: 11:00:00 via INTRAVENOUS

## 2013-03-01 NOTE — ED Notes (Signed)
Iv  Ns     tko    20    Angio   1  Att        r  Arm

## 2013-03-01 NOTE — ED Notes (Signed)
Pt  Reports  Nausea   Vomiting  /  Diarrhea     X  1  Day           Vomiting  All liquids   She  Has  Tried   Pt  Is  Weak    And      C/o  abd  Cramping  As  Well

## 2013-03-01 NOTE — ED Provider Notes (Signed)
History     CSN: [id number]  Arrival date & time 03/01/13  1020   First MD Initiated Contact with Patient 03/01/13 1025      Chief Complaint  Patient presents with  . Abdominal Cramping    (Consider location/radiation/quality/duration/timing/severity/associated sxs/prior treatment) Patient is a 32 y.o. female presenting with vomiting. The history is provided by the patient.  Emesis Severity:  Moderate Duration:  1 day Timing:  Intermittent Quality:  Stomach contents Progression:  Unchanged Chronicity:  New Recent urination:  Decreased Associated symptoms: chills, diarrhea, fever and myalgias   Associated symptoms: no abdominal pain     Past Medical History  Diagnosis Date  . No pertinent past medical history     Past Surgical History  Procedure Laterality Date  . No past surgeries      Family History  Problem Relation Age of Onset  . Hypertension Mother     History  Substance Use Topics  . Smoking status: Never Smoker   . Smokeless tobacco: Never Used  . Alcohol Use: No    OB History   Grav Para Term Preterm Abortions TAB SAB Ect Mult Living   2 1 1  1  1   1       Review of Systems  Constitutional: Positive for chills.  HENT: Negative.   Respiratory: Negative.   Cardiovascular: Negative.   Gastrointestinal: Positive for nausea, vomiting and diarrhea. Negative for abdominal pain and blood in stool.  Musculoskeletal: Positive for myalgias.  Skin: Negative.     Allergies  Review of patient's allergies indicates no known allergies.  Home Medications   Current Outpatient Rx  Name  Route  Sig  Dispense  Refill  . ondansetron (ZOFRAN) 4 MG tablet   Oral   Take 1 tablet (4 mg total) by mouth every 6 (six) hours. Prn n/v   6 tablet   0   . Prenatal Vit-Fe Fumarate-FA (PRENATAL MULTIVITAMIN) TABS   Oral   Take 1 tablet by mouth daily.           BP 91/54  Pulse 127  Temp(Src) 99.9 F (37.7 C) (Oral)  Resp 12  SpO2 100%  LMP  02/15/2013  Physical Exam  Nursing note and vitals reviewed. Constitutional: She is oriented to person, place, and time. She appears well-developed and well-nourished. She appears distressed.  HENT:  Head: Normocephalic.  Right Ear: External ear normal.  Left Ear: External ear normal.  Mouth/Throat: Oropharynx is clear and moist.  Eyes: Conjunctivae are normal. Pupils are equal, round, and reactive to light.  Neck: Normal range of motion. Neck supple.  Cardiovascular: Normal rate, normal heart sounds and intact distal pulses.   Pulmonary/Chest: Breath sounds normal.  Abdominal: Soft. She exhibits no distension and no mass. Bowel sounds are increased. There is generalized tenderness. There is no rigidity, no rebound, no guarding and no CVA tenderness.  Lymphadenopathy:    She has no cervical adenopathy.  Neurological: She is alert and oriented to person, place, and time.  Skin: Skin is warm and dry.    ED Course  Procedures (including critical care time)  Labs Reviewed - No data to display No results found.   1. Gastroenteritis       MDM          Linna Hoff, MD 03/01/13 1108

## 2013-12-07 ENCOUNTER — Encounter (HOSPITAL_COMMUNITY): Payer: Self-pay | Admitting: Emergency Medicine

## 2013-12-07 ENCOUNTER — Emergency Department (INDEPENDENT_AMBULATORY_CARE_PROVIDER_SITE_OTHER)
Admission: EM | Admit: 2013-12-07 | Discharge: 2013-12-07 | Disposition: A | Discharge status: Home / Self Care | Payer: Medicaid Other | Source: Home / Self Care | Attending: Family Medicine | Admitting: Family Medicine

## 2013-12-07 DIAGNOSIS — Z331 Pregnant state, incidental: Secondary | ICD-10-CM

## 2013-12-07 DIAGNOSIS — Z3201 Encounter for pregnancy test, result positive: Secondary | ICD-10-CM

## 2013-12-07 LAB — POCT URINALYSIS DIP (DEVICE)
Glucose, UA: NEGATIVE mg/dL
Hgb urine dipstick: NEGATIVE
Nitrite: NEGATIVE
Protein, ur: NEGATIVE mg/dL
Urobilinogen, UA: 0.2 mg/dL (ref 0.0–1.0)
pH: 7 (ref 5.0–8.0)

## 2013-12-07 NOTE — ED Provider Notes (Signed)
CSN: [id number]     Arrival date & time 12/07/13  1643 History   First MD Initiated Contact with Patient 12/07/13 1758     Chief Complaint  Patient presents with  . Possible Pregnancy   (Consider location/radiation/quality/duration/timing/severity/associated sxs/prior Treatment) Patient is a 32 y.o. female presenting with pregnancy problem and general illness. The history is provided by the patient.  Possible Pregnancy Patient reports no abdominal pain.  Illness Chronicity:  New Context:  Gip1, lmp 11/20, recent home preg test pos, needs confirm. Associated symptoms: no abdominal pain     Past Medical History  Diagnosis Date  . No pertinent past medical history    Past Surgical History  Procedure Laterality Date  . No past surgeries     Family History  Problem Relation Age of Onset  . Hypertension Mother    History  Substance Use Topics  . Smoking status: Never Smoker   . Smokeless tobacco: Never Used  . Alcohol Use: No   OB History   Grav Para Term Preterm Abortions TAB SAB Ect Mult Living   2 1 1  1  1   1      Review of Systems  Constitutional: Negative.   Gastrointestinal: Negative for abdominal pain.  Genitourinary: Negative.     Allergies  Review of patient's allergies indicates no known allergies.  Home Medications   Current Outpatient Rx  Name  Route  Sig  Dispense  Refill  . ondansetron (ZOFRAN) 4 MG tablet   Oral   Take 1 tablet (4 mg total) by mouth every 6 (six) hours. Prn n/v   6 tablet   0   . Prenatal Vit-Fe Fumarate-FA (PRENATAL MULTIVITAMIN) TABS   Oral   Take 1 tablet by mouth daily.          BP 114/67  Pulse 76  Temp(Src) 98.2 F (36.8 C) (Oral)  Resp 18  SpO2 100% Physical Exam  Nursing note and vitals reviewed. Constitutional: She is oriented to person, place, and time. She appears well-developed and well-nourished.  Abdominal: Soft. Bowel sounds are normal.  Neurological: She is alert and oriented to person, place, and  time.  Skin: Skin is warm and dry.    ED Course  Procedures (including critical care time) Labs Review Labs Reviewed  POCT PREGNANCY, URINE - Abnormal; Notable for the following:    Preg Test, Ur POSITIVE (*)    All other components within normal limits  POCT URINALYSIS DIP (DEVICE)   Imaging Review No results found.  EKG Interpretation    Date/Time:    Ventricular Rate:    PR Interval:    QRS Duration:   QT Interval:    QTC Calculation:   R Axis:     Text Interpretation:              MDM      Linna Hoff, MD 12/07/13 [phone]

## 2013-12-07 NOTE — ED Notes (Signed)
Pregnancy test

## 2014-01-06 LAB — OB RESULTS CONSOLE RPR: RPR: NONREACTIVE

## 2014-01-06 LAB — OB RESULTS CONSOLE ABO/RH: RH TYPE: POSITIVE

## 2014-01-06 LAB — OB RESULTS CONSOLE HEPATITIS B SURFACE ANTIGEN: Hepatitis B Surface Ag: NEGATIVE

## 2014-01-06 LAB — OB RESULTS CONSOLE RUBELLA ANTIBODY, IGM: RUBELLA: IMMUNE

## 2014-01-06 LAB — OB RESULTS CONSOLE HIV ANTIBODY (ROUTINE TESTING): HIV: NONREACTIVE

## 2014-01-06 LAB — OB RESULTS CONSOLE ANTIBODY SCREEN: Antibody Screen: NEGATIVE

## 2014-01-06 LAB — OB RESULTS CONSOLE GC/CHLAMYDIA
CHLAMYDIA, DNA PROBE: NEGATIVE
GC PROBE AMP, GENITAL: NEGATIVE

## 2014-07-06 ENCOUNTER — Encounter (HOSPITAL_COMMUNITY): Payer: Self-pay | Admitting: *Deleted

## 2014-07-06 ENCOUNTER — Telehealth (HOSPITAL_COMMUNITY): Payer: Self-pay | Admitting: *Deleted

## 2014-07-06 NOTE — Telephone Encounter (Signed)
Preadmission screen  

## 2014-07-09 ENCOUNTER — Other Ambulatory Visit: Payer: Self-pay | Admitting: Obstetrics and Gynecology

## 2014-07-10 ENCOUNTER — Inpatient Hospital Stay (HOSPITAL_COMMUNITY): Payer: Medicaid Other | Admitting: Anesthesiology

## 2014-07-10 ENCOUNTER — Inpatient Hospital Stay (HOSPITAL_COMMUNITY)
Admission: RE | Admit: 2014-07-10 | Discharge: 2014-07-12 | DRG: 775 | Disposition: A | Discharge status: Home / Self Care | Payer: Medicaid Other | Source: Ambulatory Visit | Attending: Obstetrics and Gynecology | Admitting: Obstetrics and Gynecology

## 2014-07-10 ENCOUNTER — Encounter (HOSPITAL_COMMUNITY): Payer: Self-pay

## 2014-07-10 ENCOUNTER — Encounter (HOSPITAL_COMMUNITY): Payer: Medicaid Other | Admitting: Anesthesiology

## 2014-07-10 VITALS — BP 111/73 | HR 65 | Temp 98.1°F | Resp 18 | Ht 63.0 in | Wt 130.0 lb

## 2014-07-10 DIAGNOSIS — O479 False labor, unspecified: Secondary | ICD-10-CM | POA: Diagnosis present

## 2014-07-10 DIAGNOSIS — Z3490 Encounter for supervision of normal pregnancy, unspecified, unspecified trimester: Secondary | ICD-10-CM

## 2014-07-10 LAB — CBC
HCT: 39.3 % (ref 36.0–46.0)
HEMOGLOBIN: 13.8 g/dL (ref 12.0–15.0)
MCH: 31.2 pg (ref 26.0–34.0)
MCHC: 35.1 g/dL (ref 30.0–36.0)
MCV: 88.9 fL (ref 78.0–100.0)
Platelets: 209 10*3/uL (ref 150–400)
RBC: 4.42 MIL/uL (ref 3.87–5.11)
RDW: 14.1 % (ref 11.5–15.5)
WBC: 10.6 10*3/uL — ABNORMAL HIGH (ref 4.0–10.5)

## 2014-07-10 LAB — TYPE AND SCREEN
ABO/RH(D): O POS
Antibody Screen: NEGATIVE

## 2014-07-10 LAB — RPR

## 2014-07-10 MED ORDER — LACTATED RINGERS IV SOLN: INTRAVENOUS | Status: AC

## 2014-07-10 MED ORDER — OXYTOCIN 40 UNITS IN LACTATED RINGERS INFUSION - SIMPLE MED: 62.5000 mL/h | INTRAVENOUS | Status: AC | PRN

## 2014-07-10 MED ORDER — OXYTOCIN 40 UNITS IN LACTATED RINGERS INFUSION - SIMPLE MED: 62.5000 mL/h | INTRAVENOUS | Status: DC

## 2014-07-10 MED ORDER — EPHEDRINE 5 MG/ML INJ
10.0000 mg | INTRAVENOUS | Status: DC | PRN
  Filled 2014-07-10: qty 2

## 2014-07-10 MED ORDER — ZOLPIDEM TARTRATE 5 MG PO TABS: 5.0000 mg | ORAL_TABLET | Freq: Every evening | ORAL | Status: DC | PRN

## 2014-07-10 MED ORDER — PRENATAL MULTIVITAMIN CH: 1.0000 | ORAL_TABLET | Freq: Every day | ORAL | Status: DC

## 2014-07-10 MED ORDER — DIPHENHYDRAMINE HCL 25 MG PO CAPS: 25.0000 mg | ORAL_CAPSULE | Freq: Four times a day (QID) | ORAL | Status: DC | PRN

## 2014-07-10 MED ORDER — DIBUCAINE 1 % RE OINT: 1.0000 "application " | TOPICAL_OINTMENT | RECTAL | Status: DC | PRN

## 2014-07-10 MED ORDER — PHENYLEPHRINE 40 MCG/ML (10ML) SYRINGE FOR IV PUSH (FOR BLOOD PRESSURE SUPPORT)
80.0000 ug | PREFILLED_SYRINGE | INTRAVENOUS | Status: DC | PRN
  Filled 2014-07-10: qty 10
  Filled 2014-07-10: qty 2

## 2014-07-10 MED ORDER — LIDOCAINE HCL (PF) 1 % IJ SOLN
30.0000 mL | INTRAMUSCULAR | Status: DC | PRN
  Filled 2014-07-10: qty 30

## 2014-07-10 MED ORDER — IBUPROFEN 600 MG PO TABS: 600.0000 mg | ORAL_TABLET | Freq: Four times a day (QID) | ORAL | Status: DC | PRN

## 2014-07-10 MED ORDER — LIDOCAINE HCL (PF) 1 % IJ SOLN
INTRAMUSCULAR | Status: DC | PRN
  Administered 2014-07-10: 10 mL

## 2014-07-10 MED ORDER — OXYTOCIN 40 UNITS IN LACTATED RINGERS INFUSION - SIMPLE MED
1.0000 m[IU]/min | INTRAVENOUS | Status: DC
  Administered 2014-07-10: 1 m[IU]/min via INTRAVENOUS
  Filled 2014-07-10: qty 1000

## 2014-07-10 MED ORDER — FENTANYL 2.5 MCG/ML BUPIVACAINE 1/10 % EPIDURAL INFUSION (WH - ANES): 14.0000 mL/h | INTRAMUSCULAR | Status: DC | PRN

## 2014-07-10 MED ORDER — FENTANYL 2.5 MCG/ML BUPIVACAINE 1/10 % EPIDURAL INFUSION (WH - ANES)
14.0000 mL/h | INTRAMUSCULAR | Status: DC | PRN
  Administered 2014-07-10: 14 mL/h via EPIDURAL
  Filled 2014-07-10: qty 125

## 2014-07-10 MED ORDER — PRENATAL MULTIVITAMIN CH
1.0000 | ORAL_TABLET | Freq: Every day | ORAL | Status: DC
  Administered 2014-07-11: 1 via ORAL
  Filled 2014-07-10: qty 1

## 2014-07-10 MED ORDER — OXYTOCIN BOLUS FROM INFUSION: 500.0000 mL | INTRAVENOUS | Status: DC

## 2014-07-10 MED ORDER — ERYTHROMYCIN 5 MG/GM OP OINT: TOPICAL_OINTMENT | Freq: Once | OPHTHALMIC | Status: DC

## 2014-07-10 MED ORDER — MEASLES, MUMPS & RUBELLA VAC ~~LOC~~ INJ
0.5000 mL | INJECTION | Freq: Once | SUBCUTANEOUS | Status: DC
  Filled 2014-07-10: qty 0.5

## 2014-07-10 MED ORDER — ONDANSETRON HCL 4 MG PO TABS
4.0000 mg | ORAL_TABLET | ORAL | Status: DC | PRN
Start: 2014-07-10 — End: 2014-07-12

## 2014-07-10 MED ORDER — WITCH HAZEL-GLYCERIN EX PADS
1.0000 "application " | MEDICATED_PAD | CUTANEOUS | Status: DC | PRN
  Administered 2014-07-11: 1 via TOPICAL

## 2014-07-10 MED ORDER — SIMETHICONE 80 MG PO CHEW: 80.0000 mg | CHEWABLE_TABLET | ORAL | Status: DC | PRN

## 2014-07-10 MED ORDER — TETANUS-DIPHTH-ACELL PERTUSSIS 5-2.5-18.5 LF-MCG/0.5 IM SUSP: 0.5000 mL | Freq: Once | INTRAMUSCULAR | Status: DC

## 2014-07-10 MED ORDER — OXYCODONE-ACETAMINOPHEN 5-325 MG PO TABS: 1.0000 | ORAL_TABLET | ORAL | Status: DC | PRN

## 2014-07-10 MED ORDER — IBUPROFEN 600 MG PO TABS
600.0000 mg | ORAL_TABLET | Freq: Four times a day (QID) | ORAL | Status: DC
  Administered 2014-07-10 – 2014-07-12 (×7): 600 mg via ORAL
  Filled 2014-07-10 (×7): qty 1

## 2014-07-10 MED ORDER — LACTATED RINGERS IV SOLN: 500.0000 mL | INTRAVENOUS | Status: DC | PRN

## 2014-07-10 MED ORDER — LANOLIN HYDROUS EX OINT: TOPICAL_OINTMENT | CUTANEOUS | Status: DC | PRN

## 2014-07-10 MED ORDER — LACTATED RINGERS IV SOLN: 500.0000 mL | Freq: Once | INTRAVENOUS | Status: DC

## 2014-07-10 MED ORDER — DIPHENHYDRAMINE HCL 50 MG/ML IJ SOLN: 12.5000 mg | INTRAMUSCULAR | Status: DC | PRN

## 2014-07-10 MED ORDER — SENNOSIDES-DOCUSATE SODIUM 8.6-50 MG PO TABS
2.0000 | ORAL_TABLET | ORAL | Status: DC
  Administered 2014-07-11 (×2): 2 via ORAL
  Filled 2014-07-10 (×2): qty 2

## 2014-07-10 MED ORDER — BENZOCAINE-MENTHOL 20-0.5 % EX AERO
1.0000 | INHALATION_SPRAY | CUTANEOUS | Status: DC | PRN
Start: 2014-07-10 — End: 2014-07-12
  Administered 2014-07-11: 1 via TOPICAL
  Filled 2014-07-10: qty 56

## 2014-07-10 MED ORDER — PHENYLEPHRINE 40 MCG/ML (10ML) SYRINGE FOR IV PUSH (FOR BLOOD PRESSURE SUPPORT)
80.0000 ug | PREFILLED_SYRINGE | INTRAVENOUS | Status: DC | PRN
  Filled 2014-07-10: qty 2

## 2014-07-10 MED ORDER — ONDANSETRON HCL 4 MG/2ML IJ SOLN: 4.0000 mg | INTRAMUSCULAR | Status: DC | PRN

## 2014-07-10 MED ORDER — LACTATED RINGERS IV SOLN
INTRAVENOUS | Status: DC
  Administered 2014-07-10 (×2): via INTRAVENOUS

## 2014-07-10 NOTE — Anesthesia Preprocedure Evaluation (Signed)

## 2014-07-10 NOTE — Anesthesia Procedure Notes (Signed)
Epidural Patient location during procedure: OB  Preanesthetic Checklist Completed: patient identified, site marked, surgical consent, pre-op evaluation, timeout performed, IV checked, risks and benefits discussed and monitors and equipment checked  Epidural Patient position: sitting Prep: site prepped and draped and DuraPrep Patient monitoring: continuous pulse ox and blood pressure Approach: midline Injection technique: LOR air  Needle:  Needle type: Tuohy  Needle gauge: 17 G Needle length: 9 cm and 9 Needle insertion depth: 4 cm Catheter type: closed end flexible Catheter size: 19 Gauge Catheter at skin depth: 10 cm Test dose: negative  Assessment Events: blood not aspirated, injection not painful, no injection resistance, negative IV test and no paresthesia  Additional Notes Dosing of Epidural:  1st dose, through catheter .............................................  Xylocaine 40 mg  2nd dose, through catheter, after waiting 3 minutes.........Xylocaine 60 mg    ( 1% Xylo charted as a single dose in Epic Meds for ease of charting; actual dosing was fractionated as above, for saftey's sake)  As each dose occurred, patient was free of IV sx; and patient exhibited no evidence of SA injection.  Patient is more comfortable after epidural dosed. Please see RN's note for documentation of vital signs,and FHR which are stable.  Patient reminded not to try to ambulate with numb legs, and that an RN must be present when she attempts to get up.       

## 2014-07-10 NOTE — H&P (Signed)
Haley Frazier, Haley Frazier                 ACCOUNT NO.:  [id number]  MEDICAL RECORD NO.:  [id number]  LOCATION:                                 FACILITY:  PHYSICIAN:  Lucille Passy. Ulanda Edison, M.D. DATE OF BIRTH:  1981-11-10  DATE OF ADMISSION:  07/10/2014 DATE OF DISCHARGE:                             HISTORY & PHYSICAL   HISTORY OF PRESENT ILLNESS:  This is a 33 year old Asian female, para 1- 0-1-1, gravida 3, EDC July 07/17/2014, admitted for induction of labor. Blood group and type O positive with a negative antibody.  RPR negative. Urine culture negative.  Hepatitis B surface antigen negative.  HIV negative.  GC and Chlamydia negative.  Rubella immune.  Pap test normal. First trimester screen negative. AFP negative.  One hour Glucola 95. Repeat HIV and RPR negative.  Group B strep negative.  The patient began her prenatal course early in pregnancy.  The ultrasound at 12 weeks and 5 days placed her due date at July 16, 2014.  She had some peak spotting at about 11 weeks.  At 18 and 19 week, she had a normal ultrasound except that showed an echogenic intracardiac focus in the left ventricle.  She had an uncomplicated prenatal course except her weight gain was only 14 pounds.  She is admitted for induction of labor.  PAST MEDICAL HISTORY:  Revealed seasonal allergies.  No major adult illnesses.  PAST SURGICAL HISTORY:  None.  ALLERGIES:  No known drug allergies.  No latex or food allergies.  SOCIAL HISTORY:  Never smoked.  Does not drink.  Denies illicit drugs. 12th grade education.  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 Single.  Partner's name is Gerald Stabs.  PHYSICAL EXAMINATION:  VITAL SIGNS:  On admission, on July 07, 2014, she was examined in the office.  Blood pressure is 100/70, pulse was 70. HEART:  Normal size and sounds.  No murmurs. LUNGS:  Clear to auscultation.  Fundal height was 37 cm.  Fetal heart tones were 132.  Cervix was 2 cm, 50%, vertex at a -2.  ADMITTING  IMPRESSION:  Intrauterine pregnancy at 39 weeks, admitted for induction of labor.     Lucille Passy. Ulanda Edison, M.D.     TFH/MEDQ  D:  07/09/2014  T:  07/09/2014  Job:  [id number]

## 2014-07-10 NOTE — Progress Notes (Signed)
Patient ID: Haley Frazier, female   DOB: 1981/06/28, 33 y.o.   MRN: [id number] Pt has been started on pitocin and she is having contractions q 3 minutes. The FHR is normal. AROM produced clear fluid with a bloody stain. Cervix is 3 cm 70% effaced and the vertex is at - 2 station.

## 2014-07-10 NOTE — Progress Notes (Signed)
Patient ID: Haley Frazier, female   DOB: 1981-11-03, 33 y.o.   MRN: [id number] Delivery note:  I asked the pt to quit pushing because I had to attend a delivery in another room. This was no problem for her because her epidural was very effective. When I returned to the room she was able to deliver a living femlae infant spontaneously LOA over an intact perineum with several contractions. The Apgars were 9 and 9 at 1 and 5 minutes. The placenta was kept for cord blood donation. It was removed intact and the uterus was normal. There was a small laceration on the left labia at 4 o'clock. It was sutured with 3-0 vicryl. EBL 400 cc's.

## 2014-07-10 NOTE — Progress Notes (Signed)
Patient ID: Haley Frazier, female   DOB: [birth year], 33 y.o.   MRN: [id number] Pitocin at 9 mu/ minute and the contractions q 2-3 minutes At 12:45 PM the cervix was 10 cm The vertex was at + 2 station.

## 2014-07-11 LAB — CBC
HEMATOCRIT: 35.7 % — AB (ref 36.0–46.0)
Hemoglobin: 12 g/dL (ref 12.0–15.0)
MCH: 30.2 pg (ref 26.0–34.0)
MCHC: 33.6 g/dL (ref 30.0–36.0)
MCV: 89.7 fL (ref 78.0–100.0)
Platelets: 174 10*3/uL (ref 150–400)
RBC: 3.98 MIL/uL (ref 3.87–5.11)
RDW: 14.1 % (ref 11.5–15.5)
WBC: 11.8 10*3/uL — ABNORMAL HIGH (ref 4.0–10.5)

## 2014-07-11 NOTE — Progress Notes (Signed)
Patient ID: Haley Frazier, female   DOB: 1981-05-14, 34 y.o.   MRN: [id number] #1 afebrile BP normal HGB stable No complaints

## 2014-07-11 NOTE — Lactation Note (Signed)
This note was copied from the chart of Girl Jamaris Bradway. Lactation Consultation Note  The tips of mother's nipples are pink and has axillary breast tissue under both arms. Mother placed baby in football hold on left side.  It took a little while before baby latched, but sucks and swallows observed for 10 min. Baby fussy.  Mother then moved to cradle position, Mother leans in to baby, encouraged her to sit back and demonstrated cross cradle.  Baby sucked on and off. Encouraged STS after feeding.    Patient Name: Girl Mega Kinkade ZTIWP'Y Date: 07/11/2014 Reason for consult: Follow-up assessment   Maternal Data    Feeding Feeding Type: Breast Fed Length of feed: 30 min  LATCH Score/Interventions Latch: Repeated attempts needed to sustain latch, nipple held in mouth throughout feeding, stimulation needed to elicit sucking reflex. Intervention(s): Skin to skin Intervention(s): Assist with latch;Adjust position;Breast massage;Breast compression  Audible Swallowing: A few with stimulation Intervention(s): Skin to skin  Type of Nipple: Everted at rest and after stimulation  Comfort (Breast/Nipple): Soft / non-tender     Hold (Positioning): Assistance needed to correctly position infant at breast and maintain latch.  LATCH Score: 7  Lactation Tools Discussed/Used     Consult Status Consult Status: Follow-up Date: 07/12/14 Follow-up type: In-patient    Vivianne Master [facility] 07/11/2014, 2:16 PM

## 2014-07-11 NOTE — Anesthesia Postprocedure Evaluation (Signed)
  Anesthesia Post-op Note  Patient: Haley Frazier  Procedure(s) Performed: * No procedures listed *  Patient Location: Mother/Baby  Anesthesia Type:Spinal and Epidural  Level of Consciousness: awake  Airway and Oxygen Therapy: Patient Spontanous Breathing  Post-op Pain: none  Post-op Assessment: Patient's Cardiovascular Status Stable, Respiratory Function Stable, Patent Airway, No signs of Nausea or vomiting, Adequate PO intake, Pain level controlled, No headache, No backache, No residual numbness and No residual motor weakness  Post-op Vital Signs: Reviewed and stable  Last Vitals:  Filed Vitals:   07/11/14 0619  BP: 99/65  Pulse: 68  Temp: 36.6 C  Resp: 20    Complications: No apparent anesthesia complications

## 2014-07-11 NOTE — Lactation Note (Signed)
This note was copied from the chart of Haley Heloise Hildebrant. Lactation Consultation Note Baby is a poor feeder, has low respirations, good color noted. Will not substain a latch, doesn't open mouth very wide, need to gently tug chin for depth. Mom breast are compressible. Mom hand expressed colostrum and gave in spoon d/t baby wouldn't latch. Mom reports + breast changes w/pregnancy. Mom encouraged to feed baby w/feeding cues. Mom encouraged to feed baby 8-12 times/24 hours and with feeding cues. Reviewed Baby & Me book's Breastfeeding Basics. Educated about newborn behavior. Encouraged to call for assistance if needed and to verify proper latch. Praised mom for spoon feeding since baby wouldn't latch. Mom doing STS. Explained the importance of STS. Patient Name: Haley Frazier ITGPQ'D Date: 07/11/2014 Reason for consult: Difficult latch   Maternal Data    Feeding Length of feed: 5 min  LATCH Score/Interventions Latch: Repeated attempts needed to sustain latch, nipple held in mouth throughout feeding, stimulation needed to elicit sucking reflex. Intervention(s): Skin to skin;Teach feeding cues;Waking techniques Intervention(s): Adjust position;Assist with latch;Breast massage;Breast compression  Audible Swallowing: None Intervention(s): Skin to skin;Hand expression  Type of Nipple: Everted at rest and after stimulation  Comfort (Breast/Nipple): Soft / non-tender     Hold (Positioning): Assistance needed to correctly position infant at breast and maintain latch.  LATCH Score: 6  Lactation Tools Discussed/Used     Consult Status Consult Status: Follow-up Date: 07/11/14 Follow-up type: In-patient    Theodoro Kalata 07/11/2014, 4:49 AM

## 2014-07-12 MED ORDER — IBUPROFEN 600 MG PO TABS: 600.0000 mg | ORAL_TABLET | Freq: Four times a day (QID) | ORAL | Status: DC | PRN

## 2014-07-12 MED ORDER — OXYCODONE-ACETAMINOPHEN 5-325 MG PO TABS: 1.0000 | ORAL_TABLET | Freq: Four times a day (QID) | ORAL | Status: DC | PRN

## 2014-07-12 NOTE — Discharge Instructions (Signed)
booklet Breast Pumping Tips If you are breastfeeding, there may be times when you cannot feed your baby directly. Returning to work or going on a trip are examples. Pumping allows you to store breast milk and feed it to your baby later.  You may not get much milk when you first start to pump. Your breasts should start to make more after a few days. If you pump at the times you usually feed your baby, you may be able to keep making enough milk to feed your baby without also using formula. The more often you pump, the more milk your body will make. WHEN SHOULD I PUMP?   You can start to pump soon after you have your baby. Ask your doctor what is right for you and your baby.  If you are going back to work, start pumping a few weeks before. This gives you time to learn how to pump and to store a supply of milk.  When you are with your baby, feed your baby when he or she is hungry. Pump after each feeding.  When you are away from your baby for many hours, pump for about 15 minutes every 2-3 hours. Pump both breasts at the same time if you can.  If your baby has a formula feeding, make sure to pump close to the same time.  If you drink any alcohol, wait 2 hours before pumping. HOW DO I GET READY TO PUMP? Your let-down reflex is your body's natural reaction that makes your breast milk flow. It is easier to make your breast milk flow when you are relaxed. Try these things to help you relax:  Smell one of your baby's blankets or an item of clothing.  Look at a picture or video of your baby.  Sit in a quiet, private space.  Massage the breast you plan to pump.  Place soothing warmth on the breast.  Play relaxing music. WHAT ARE SOME BREAST PUMPING TIPS?  Wash your hands before you pump. You do not need to wash your nipples or breasts.  There are three ways to pump. You can:  Use your hand to massage and squeeze your breast.  Use a handheld manual pump.  Use an electric pump.  Make  sure the suction cup on the breast pump is the right size. Place the suction cup directly over the nipple. It can be painful or hurt your nipple if it is the wrong size or placed wrong.  Put a small amount of purified or modified lanolin on your nipple and areola if you are sore.  If you are using an electric pump, change the speed and suction power to be more comfortable.  You may need a different type of pump if pumping hurts or you do not get a lot of milk. Your doctor can help you pick what type of pump to use.  Keep a full water bottle near you always. Drinking lots of fluid helps you make more milk.  You can store your milk to use later. Pumped breast milk can be stored in a sealable, sterile container or plastic bag. Always put the date you pumped it on the container.  Milk can stay out at room temperature for up to 8 hours.  You can store your milk in the refrigerator for up to 8 days.  You can store your milk in the freezer for 3 months. Thaw frozen milk using warm water. Do not put it in the microwave.  Do not  smoke. Ask your doctor for help. WHEN SHOULD I CALL MY DOCTOR?  You have a hard time pumping.  You are worried you do not make enough milk.  You have nipple pain, soreness, or redness.  You want to take birth control pills. Document Released: 05/26/2008 Document Revised: 12/13/2013 Document Reviewed: 09/30/2013 [facility] Patient Information 2015 [place], [place]. This information is not intended to replace advice given to you by your health care provider. Make sure you discuss any questions you have with your health care provider.

## 2014-07-12 NOTE — Discharge Summary (Signed)
Haley Frazier, Haley Frazier                 ACCOUNT NO.:  [id number]  MEDICAL RECORD NO.:  [id number]  LOCATION:  9143                          FACILITY:  [facility]  PHYSICIAN:  Lucille Passy. Ulanda Edison, M.D. DATE OF BIRTH:  1981/04/14  DATE OF ADMISSION:  07/10/2014 DATE OF DISCHARGE:  07/12/2014                              DISCHARGE SUMMARY   This is a 33 year old Asian female, para 1-0-1-1, gravida 3, EDC, July 17, 2014, admitted for induction of labor.  All her prenatal labs and tests were normal.  She began her prenatal course early in pregnancy, had an ultrasound at 12 weeks and 5 days, normal ultrasound except an echogenic intracardiac focus in the left ventricle, uncomplicated prenatal course, except for weight gain of only 14 pounds.  On admission, her cervix was 2 cm, 50%.  She was admitted and started on Pitocin.  When she began having painful contractions, she was given an epidural.  At 12:55 p.m., the contractions were every 2-3 minutes and the cervix was found to be fully dilated.  She had already received an epidural.  The vertex was at a +2 station.  She was asked to stop pushing, during her pushing efforts, because I had to attend the delivery in another room.  There was no problem because her epidural was very effective, when I returned to the room,  she was able to deliver a living female infant spontaneously, LOA over an intact perineum with several contractions.  The Apgars were 9 and 9 at 1 and 5 minutes.  The placenta was evaluated for cord blood donation and cord blood donation was done under sterile conditions.  A small laceration on the left labia was at 4 o'clock, was sutured with 3-0 Vicryl.  Blood loss 400 mL. Postpartum, the patient did well and was discharged on the second postpartum day.  LABORATORY DATA:  Showed initial hemoglobin of 13.8, hematocrit 39.3, white count 10,600, platelet count 209,000.  RPR nonreactive.  Followup hemoglobin 12.0.  FINAL DIAGNOSIS:   Intrauterine pregnancy at 39 weeks, delivered vertex.  OPERATION:  Spontaneous delivery, vertex; repair of small laceration.  FINAL CONDITION:  Improved.  INSTRUCTIONS:  Include our regular discharge instruction booklet, the after visit summary, and prescriptions for Percocet 5/325, 30 tablets, 1 every 6 hours as needed for pain and Motrin 600 mg 30 tablets, 1 every 6 hours as needed for pain.  She is asked to return to the office in 6 weeks for followup examination.     Lucille Passy. Ulanda Edison, M.D.     TFH/MEDQ  D:  07/12/2014  T:  07/12/2014  Job:  [id number]

## 2014-07-12 NOTE — Progress Notes (Signed)
Patient ID: Haley Frazier, female   DOB: [birth year], 34 y.o.   MRN: [id number] #2 afebrile for d/c

## 2014-07-12 NOTE — Lactation Note (Signed)
This note was copied from the chart of Haley Frazier. Lactation Consultation Note: Mom reports that baby is latching better, Report that breasts are feeling fuller this morning. Needed some assist with latch. Reviewed basics.Reviewed engorgement prevention and treatment. Has hand pump for home. No questions at present. To call prn  Patient Name: Haley Frazier AJOIN'O Date: 07/12/2014 Reason for consult: Follow-up assessment   Maternal Data Formula Feeding for Exclusion: No Infant to breast within first hour of birth: Yes Has patient been taught Hand Expression?: Yes Does the patient have breastfeeding experience prior to this delivery?: Yes  Feeding Feeding Type: Breast Fed Length of feed: 10 min  LATCH Score/Interventions Latch: Grasps breast easily, tongue down, lips flanged, rhythmical sucking.  Audible Swallowing: Spontaneous and intermittent  Type of Nipple: Everted at rest and after stimulation  Comfort (Breast/Nipple): Soft / non-tender     Hold (Positioning): Assistance needed to correctly position infant at breast and maintain latch. Intervention(s): Breastfeeding basics reviewed;Support Pillows;Position options  LATCH Score: 9  Lactation Tools Discussed/Used     Consult Status Consult Status: Complete    Truddie Crumble 07/12/2014, 8:21 AM

## 2014-07-31 ENCOUNTER — Emergency Department (INDEPENDENT_AMBULATORY_CARE_PROVIDER_SITE_OTHER): Payer: Medicaid Other

## 2014-07-31 ENCOUNTER — Encounter (HOSPITAL_COMMUNITY): Payer: Self-pay | Admitting: Emergency Medicine

## 2014-07-31 ENCOUNTER — Emergency Department (HOSPITAL_COMMUNITY)
Admission: EM | Admit: 2014-07-31 | Discharge: 2014-07-31 | Disposition: A | Discharge status: Home / Self Care | Payer: Medicaid Other | Source: Home / Self Care | Attending: Family Medicine | Admitting: Family Medicine

## 2014-07-31 DIAGNOSIS — K2970 Gastritis, unspecified, without bleeding: Secondary | ICD-10-CM

## 2014-07-31 DIAGNOSIS — K2990 Gastroduodenitis, unspecified, without bleeding: Secondary | ICD-10-CM

## 2014-07-31 LAB — COMPREHENSIVE METABOLIC PANEL
ALT: 26 U/L (ref 0–35)
AST: 25 U/L (ref 0–37)
Albumin: 3.9 g/dL (ref 3.5–5.2)
Alkaline Phosphatase: 75 U/L (ref 39–117)
Anion gap: 12 (ref 5–15)
BUN: 19 mg/dL (ref 6–23)
CALCIUM: 9.4 mg/dL (ref 8.4–10.5)
CO2: 27 mEq/L (ref 19–32)
Chloride: 106 mEq/L (ref 96–112)
Creatinine, Ser: 0.58 mg/dL (ref 0.50–1.10)
GFR calc non Af Amer: >90 mL/min (ref >90)
Glucose, Bld: 92 mg/dL (ref 70–99)
Potassium: 4.2 mEq/L (ref 3.7–5.3)
SODIUM: 145 meq/L (ref 137–147)
Total Bilirubin: 0.3 mg/dL (ref 0.3–1.2)
Total Protein: 8 g/dL (ref 6.0–8.3)

## 2014-07-31 LAB — POCT URINALYSIS DIP (DEVICE)
Bilirubin Urine: NEGATIVE
Glucose, UA: NEGATIVE mg/dL
Hgb urine dipstick: NEGATIVE
Ketones, ur: NEGATIVE mg/dL
Nitrite: NEGATIVE
PH: 7 (ref 5.0–8.0)
PROTEIN: NEGATIVE mg/dL
SPECIFIC GRAVITY, URINE: 1.02 (ref 1.005–1.030)
Urobilinogen, UA: 0.2 mg/dL (ref 0.0–1.0)

## 2014-07-31 LAB — CBC
HCT: 44.8 % (ref 36.0–46.0)
HEMOGLOBIN: 15 g/dL (ref 12.0–15.0)
MCH: 30.1 pg (ref 26.0–34.0)
MCHC: 33.5 g/dL (ref 30.0–36.0)
MCV: 90 fL (ref 78.0–100.0)
PLATELETS: 239 10*3/uL (ref 150–400)
RBC: 4.98 MIL/uL (ref 3.87–5.11)
RDW: 13.4 % (ref 11.5–15.5)
WBC: 7.7 10*3/uL (ref 4.0–10.5)

## 2014-07-31 LAB — LIPASE, BLOOD: LIPASE: 29 U/L (ref 11–59)

## 2014-07-31 LAB — POCT PREGNANCY, URINE: Preg Test, Ur: NEGATIVE

## 2014-07-31 MED ORDER — RANITIDINE HCL 300 MG PO CAPS: 300.0000 mg | ORAL_CAPSULE | Freq: Every evening | ORAL | Status: DC

## 2014-07-31 NOTE — ED Provider Notes (Signed)
Medical screening examination/treatment/procedure(s) were performed by a resident physician or non-physician practitioner and as the supervising physician I was immediately available for consultation/collaboration.  Linna Darner, MD Family Medicine   Waldemar Dickens, MD 07/31/14 [phone]

## 2014-07-31 NOTE — ED Notes (Signed)
Reports center, epigastric pain for 5 days. No vomiting, some nausea.  Last bm was 3 days ago and used dulcolax for that bm, no change in epigastric pain.  Infant 61 weeks old, mother is breast feeding, child vaginal delivery, mother is taking percocet intermittently

## 2014-07-31 NOTE — ED Provider Notes (Signed)
CSN: [id number]     Arrival date & time 07/31/14  1016 History   First MD Initiated Contact with Patient 07/31/14 1040     Chief Complaint  Patient presents with  . Abdominal Pain   (Consider location/radiation/quality/duration/timing/severity/associated sxs/prior Treatment) HPI Comments: Denies change in symptoms with meal intake. No previous episodes or abdominal surgeries. Has not discussed symptoms with ObGyn. No PCP Is breast and bottle feeding. No reported GU sx.  Patient is a 32 y.o. female presenting with abdominal pain. The history is provided by the patient and the spouse.  Abdominal Pain Pain location:  Epigastric Pain quality: aching and burning   Pain radiates to:  Does not radiate Pain severity:  Mild Onset quality:  Gradual Duration:  5 days Progression:  Waxing and waning Chronicity:  New Context: not alcohol use, not awakening from sleep, not diet changes, not eating, not laxative use, not medication withdrawal, not previous surgeries, not recent illness, not recent travel, not retching, not sick contacts, not suspicious food intake and not trauma   Context comment:  +3 weeks post partum with uncomplicated vaginal delivery Relieved by:  None tried Worsened by:  Nothing tried Ineffective treatments:  None tried Associated symptoms: constipation and nausea   Associated symptoms: no anorexia, no belching, no chest pain, no chills, no cough, no diarrhea, no dysuria, no fatigue, no fever, no flatus, no hematemesis, no hematochezia, no hematuria, no melena, no shortness of breath, no sore throat, no vaginal bleeding, no vaginal discharge and no vomiting     Past Medical History  Diagnosis Date  . No pertinent past medical history   . Breast feeding status of mother    Past Surgical History  Procedure Laterality Date  . No past surgeries     Family History  Problem Relation Age of Onset  . Hypertension Mother    History  Substance Use Topics  . Smoking status:  Never Smoker   . Smokeless tobacco: Never Used  . Alcohol Use: No   OB History   Grav Para Term Preterm Abortions TAB SAB Ect Mult Living   3 2 2  1  1   2      Review of Systems  Constitutional: Negative for fever, chills and fatigue.  HENT: Negative for sore throat.   Respiratory: Negative for cough and shortness of breath.   Cardiovascular: Negative for chest pain.  Gastrointestinal: Positive for nausea, abdominal pain and constipation. Negative for vomiting, diarrhea, melena, hematochezia, anorexia, flatus and hematemesis.  Genitourinary: Negative for dysuria, hematuria, vaginal bleeding and vaginal discharge.  All other systems reviewed and are negative.   Allergies  Review of patient's allergies indicates no known allergies.  Home Medications   Prior to Admission medications   Medication Sig Start Date End Date Taking? Authorizing Provider  ibuprofen (ADVIL,MOTRIN) 600 MG tablet Take 1 tablet (600 mg total) by mouth every 6 (six) hours as needed. 07/12/14   Melina Schools, MD  ondansetron (ZOFRAN) 4 MG tablet Take 1 tablet (4 mg total) by mouth every 6 (six) hours. Prn n/v 03/01/13   Billy Fischer, MD  oxyCODONE-acetaminophen (PERCOCET/ROXICET) 5-325 MG per tablet Take 1 tablet by mouth every 6 (six) hours as needed for moderate pain. 07/12/14   Melina Schools, MD  Prenatal Vit-Fe Fumarate-FA (PRENATAL MULTIVITAMIN) TABS Take 1 tablet by mouth daily.    Historical Provider, MD  ranitidine (ZANTAC) 300 MG capsule Take 1 capsule (300 mg total) by mouth every evening. 07/31/14   Anderson Malta  Vonda Antigua Synethia Endicott, PA   BP 107/69  Pulse 78  Temp(Src) 98.4 F (36.9 C) (Oral)  Resp 18  SpO2 100%  LMP 10/10/2013  Breastfeeding? Yes Physical Exam  Nursing note and vitals reviewed. Constitutional: She is oriented to person, place, and time. She appears well-developed and well-nourished. No distress.  HENT:  Head: Normocephalic and atraumatic.  Eyes: Conjunctivae are normal. No scleral  icterus.  Neck: Normal range of motion. Neck supple.  Cardiovascular: Normal rate, regular rhythm and normal heart sounds.   Pulmonary/Chest: Effort normal and breath sounds normal.  Abdominal: Soft. Normal appearance and bowel sounds are normal. There is no hepatosplenomegaly. There is tenderness in the epigastric area. There is no rigidity, no rebound, no guarding, no CVA tenderness and negative Murphy's sign. No hernia.    Musculoskeletal: Normal range of motion.  Neurological: She is alert and oriented to person, place, and time.  Skin: Skin is warm and dry. No rash noted. No erythema.  Psychiatric: She has a normal mood and affect. Her behavior is normal.    ED Course  Procedures (including critical care time) Labs Review Labs Reviewed  POCT URINALYSIS DIP (DEVICE) - Abnormal; Notable for the following:    Leukocytes, UA TRACE (*)    All other components within normal limits  CBC  COMPREHENSIVE METABOLIC PANEL  LIPASE, BLOOD  POCT PREGNANCY, URINE    Imaging Review Dg Abd 2 Views  07/31/2014   CLINICAL DATA:  Epigastric abdominal pain.  Three weeks postpartum.  EXAM: ABDOMEN - 2 VIEW  COMPARISON:  None.  FINDINGS: There is no free air or free fluid. Bowel gas pattern is normal. No dilated bowel. No worrisome abdominal calcifications. Osseous structures are normal.  IMPRESSION: Benign appearing abdomen.   Electronically Signed   By: Rozetta Nunnery M.D.   On: 07/31/2014 12:08     MDM   1. Gastritis    Clinical exam along with lab and xray results do not suggest acute abdominal process such as pancreatitis or acute cholecystitis. [facility] pharmacist to discuss medication to treat gastritis that is safe for use in lactation. Advised by hospital pharmacist that Zantac recommended for use. Will initiate treatment with Zantac 300mg  QHS. Patient provided with copies of lab and xray results along with information regarding gastritis and reasons to seek immediate  re-evaluation. Advised to follow up with PCP or ObGyn if symptoms do not improve. She and her husband voice understanding of diagnosis, treatment and follow up plan.    Lutricia Feil, [place] 07/31/14 [phone]

## 2014-07-31 NOTE — Discharge Instructions (Signed)
Your labs and xrays were all normal. You may be experiencing a bit of inflammation of the lining of your stomach and I have contacted the [facility] pharmacist to discuss medications that are safe with breat feeding for this condition. Zantac is recommended as safe for use in breast feeding mothers. Please take as directed and if symptoms do not begin to improve over the next 2-3 days, please follow up with your doctor. Bring copies of labwork and xrays to follow up visit. These are attached to discharge instructions. Should symptoms become suddenly worse or severe, please seek re-evaluation at your nearest Emergency Room.   Gastritis, Adult Gastritis is soreness and swelling (inflammation) of the lining of the stomach. Gastritis can develop as a sudden onset (acute) or long-term (chronic) condition. If gastritis is not treated, it can lead to stomach bleeding and ulcers. CAUSES  Gastritis occurs when the stomach lining is weak or damaged. Digestive juices from the stomach then inflame the weakened stomach lining. The stomach lining may be weak or damaged due to viral or bacterial infections. One common bacterial infection is the Helicobacter pylori infection. Gastritis can also result from excessive alcohol consumption, taking certain medicines, or having too much acid in the stomach.  SYMPTOMS  In some cases, there are no symptoms. When symptoms are present, they may include:  Pain or a burning sensation in the upper abdomen.  Nausea.  Vomiting.  An uncomfortable feeling of fullness after eating. DIAGNOSIS  Your caregiver may suspect you have gastritis based on your symptoms and a physical exam. To determine the cause of your gastritis, your caregiver may perform the following:  Blood or stool tests to check for the H pylori bacterium.  Gastroscopy. A thin, flexible tube (endoscope) is passed down the esophagus and into the stomach. The endoscope has a light and camera on the end.  Your caregiver uses the endoscope to view the inside of the stomach.  Taking a tissue sample (biopsy) from the stomach to examine under a microscope. TREATMENT  Depending on the cause of your gastritis, medicines may be prescribed. If you have a bacterial infection, such as an H pylori infection, antibiotics may be given. If your gastritis is caused by too much acid in the stomach, H2 blockers or antacids may be given. Your caregiver may recommend that you stop taking aspirin, ibuprofen, or other nonsteroidal anti-inflammatory drugs (NSAIDs). HOME CARE INSTRUCTIONS  Only take over-the-counter or prescription medicines as directed by your caregiver.  If you were given antibiotic medicines, take them as directed. Finish them even if you start to feel better.  Drink enough fluids to keep your urine clear or pale yellow.  Avoid foods and drinks that make your symptoms worse, such as:  Caffeine or alcoholic drinks.  Chocolate.  Peppermint or mint flavorings.  Garlic and onions.  Spicy foods.  Citrus fruits, such as oranges, lemons, or limes.  Tomato-based foods such as sauce, chili, salsa, and pizza.  Fried and fatty foods.  Eat small, frequent meals instead of large meals. SEEK IMMEDIATE MEDICAL CARE IF:   You have black or dark red stools.  You vomit blood or material that looks like coffee grounds.  You are unable to keep fluids down.  Your abdominal pain gets worse.  You have a fever.  You do not feel better after 1 week.  You have any other questions or concerns. MAKE SURE YOU:  Understand these instructions.  Will watch your condition.  Will get help right  away if you are not doing well or get worse. Document Released: 12/02/2001 Document Revised: 06/08/2012 Document Reviewed: 01/21/2012 [facility] Patient Information 2015 [place], [place]. This information is not intended to replace advice given to you by your health care provider. Make sure you discuss any  questions you have with your health care provider.

## 2014-10-23 ENCOUNTER — Encounter (HOSPITAL_COMMUNITY): Payer: Self-pay | Admitting: Emergency Medicine

## 2015-12-10 ENCOUNTER — Encounter: Payer: Self-pay | Admitting: Physician Assistant

## 2015-12-10 ENCOUNTER — Ambulatory Visit (INDEPENDENT_AMBULATORY_CARE_PROVIDER_SITE_OTHER): Payer: 59 | Admitting: Physician Assistant

## 2015-12-10 DIAGNOSIS — J329 Chronic sinusitis, unspecified: Secondary | ICD-10-CM | POA: Diagnosis not present

## 2015-12-10 MED ORDER — LORATADINE 10 MG PO TABS: 10.0000 mg | ORAL_TABLET | Freq: Every day | ORAL | Status: DC

## 2015-12-10 MED ORDER — FLUTICASONE PROPIONATE 50 MCG/ACT NA SUSP: 2.0000 | Freq: Every day | NASAL | Status: DC

## 2015-12-10 MED ORDER — PREDNISONE 20 MG PO TABS: ORAL_TABLET | ORAL | Status: DC

## 2015-12-10 NOTE — Patient Instructions (Signed)
Please take the medication as prescribed.  After the prednisone, you will stay on both the flonase and zyrtec.  But I would like you to slowly take away the flonase.  You will then stay on the zyrtec, and take the flonase with nasal congestion.  I will have you follow up in 4 weeks to see how this is doing.

## 2015-12-13 ENCOUNTER — Encounter: Payer: Self-pay | Admitting: Physician Assistant

## 2015-12-13 NOTE — Progress Notes (Signed)
[facility] [address] [place] [address] [phone]  Date:  12/10/2015   Name:  Haley Frazier   DOB:  March 27, 1981   MRN:  [id number]  PCP:  Melina Schools, MD    History of Present Illness:  Haley Frazier is a 34 y.o. female patient who presents to [facility] for cc of sinus issues.  This has been chronic for the last 7 years, but recently started symptoms of nasal congestion, rhinorrhea, and ear discomfort over the last 2 weeks.  Patient reports that every year it becomes colder, where she develops this congestion.  Rhinorrhea is clear.  There is no minimal sneezing or watery eyes.  No facial pain or fever.  She has no cough or fatigue.  She has not seen an allergist or ENT.  No known allergies.       Patient Active Problem List   Diagnosis Date Noted  . Pregnancy 07/10/2014  . SVD (spontaneous vaginal delivery) 07/10/2014    Past Medical History  Diagnosis Date  . No pertinent past medical history   . Breast feeding status of mother     Past Surgical History  Procedure Laterality Date  . No past surgeries      Social History  Substance Use Topics  . Smoking status: Never Smoker   . Smokeless tobacco: Never Used  . Alcohol Use: No    Family History  Problem Relation Age of Onset  . Hypertension Mother     No Known Allergies  Medication list has been reviewed and updated.  No current outpatient prescriptions on file prior to visit.   No current facility-administered medications on file prior to visit.    ROS ROS otherwise unremarkable unless listed above.   Physical Examination: BP 104/62 mmHg  Pulse 81  Temp(Src) 97 F (36.1 C) (Oral)  Resp 16  Ht 5\' 3"  (1.6 m)  Wt 118 lb (53.524 kg)  BMI 20.91 kg/m2  SpO2 99%  LMP 11/22/2015 Ideal Body Weight: Weight in (lb) to have BMI = 25: 140.8  Physical Exam  Constitutional: She is oriented to person, place, and time. She appears well-developed and well-nourished. No distress.  HENT:   Head: Normocephalic and atraumatic.  Right Ear: Tympanic membrane, external ear and ear canal normal.  Left Ear: Tympanic membrane, external ear and ear canal normal.  Nose: Mucosal edema (very edematous) and rhinorrhea (clear) present. Right sinus exhibits no maxillary sinus tenderness and no frontal sinus tenderness. Left sinus exhibits no maxillary sinus tenderness and no frontal sinus tenderness.  Mouth/Throat: No uvula swelling. No oropharyngeal exudate, posterior oropharyngeal edema or posterior oropharyngeal erythema.  Eyes: Conjunctivae and EOM are normal. Pupils are equal, round, and reactive to light. Right conjunctiva is not injected. Left conjunctiva is not injected.  Cardiovascular: Normal rate and regular rhythm.  Exam reveals no gallop, no distant heart sounds and no friction rub.   No murmur heard. Pulmonary/Chest: Effort normal. No respiratory distress. She has no decreased breath sounds. She has no wheezes. She has no rhonchi.  Lymphadenopathy:       Head (right side): No submandibular, no tonsillar, no preauricular and no posterior auricular adenopathy present.       Head (left side): No submandibular, no tonsillar, no preauricular and no posterior auricular adenopathy present.    She has no cervical adenopathy.  Neurological: She is alert and oriented to person, place, and time.  Skin: She is not diaphoretic.  Psychiatric: She has a normal mood  and affect. Her behavior is normal.     Assessment and Plan: Haley Frazier is a 34 y.o. female who is here today for sinus issues. -starting her on a prednisone taper.  Advised to start Flonase and allergy medication.  She will use the Flonase for 1 week after prednisone and wean from nasal spray, taking only Claritin.  I am advising that she return in 4 weeks for follow up.   -advised to do flu vaccine at return as, this may blur treatment plan of rhinosinusitis.  Chronic sinusitis, unspecified location - Plan: fluticasone  (FLONASE) 50 MCG/ACT nasal spray, loratadine (CLARITIN) 10 MG tablet, predniSONE (DELTASONE) 20 MG tablet    Ivar Drape, PA-C [facility] 12/13/2015 9:38 AM

## 2016-01-02 IMAGING — CR DG ABDOMEN 2V
2 series · 2 of 2 positions shown · non-contrast
Comparison: None.

CLINICAL DATA: Epigastric abdominal pain.  Three weeks postpartum.

EXAM:
ABDOMEN - 2 VIEW

[view not recorded (1 of 2)]
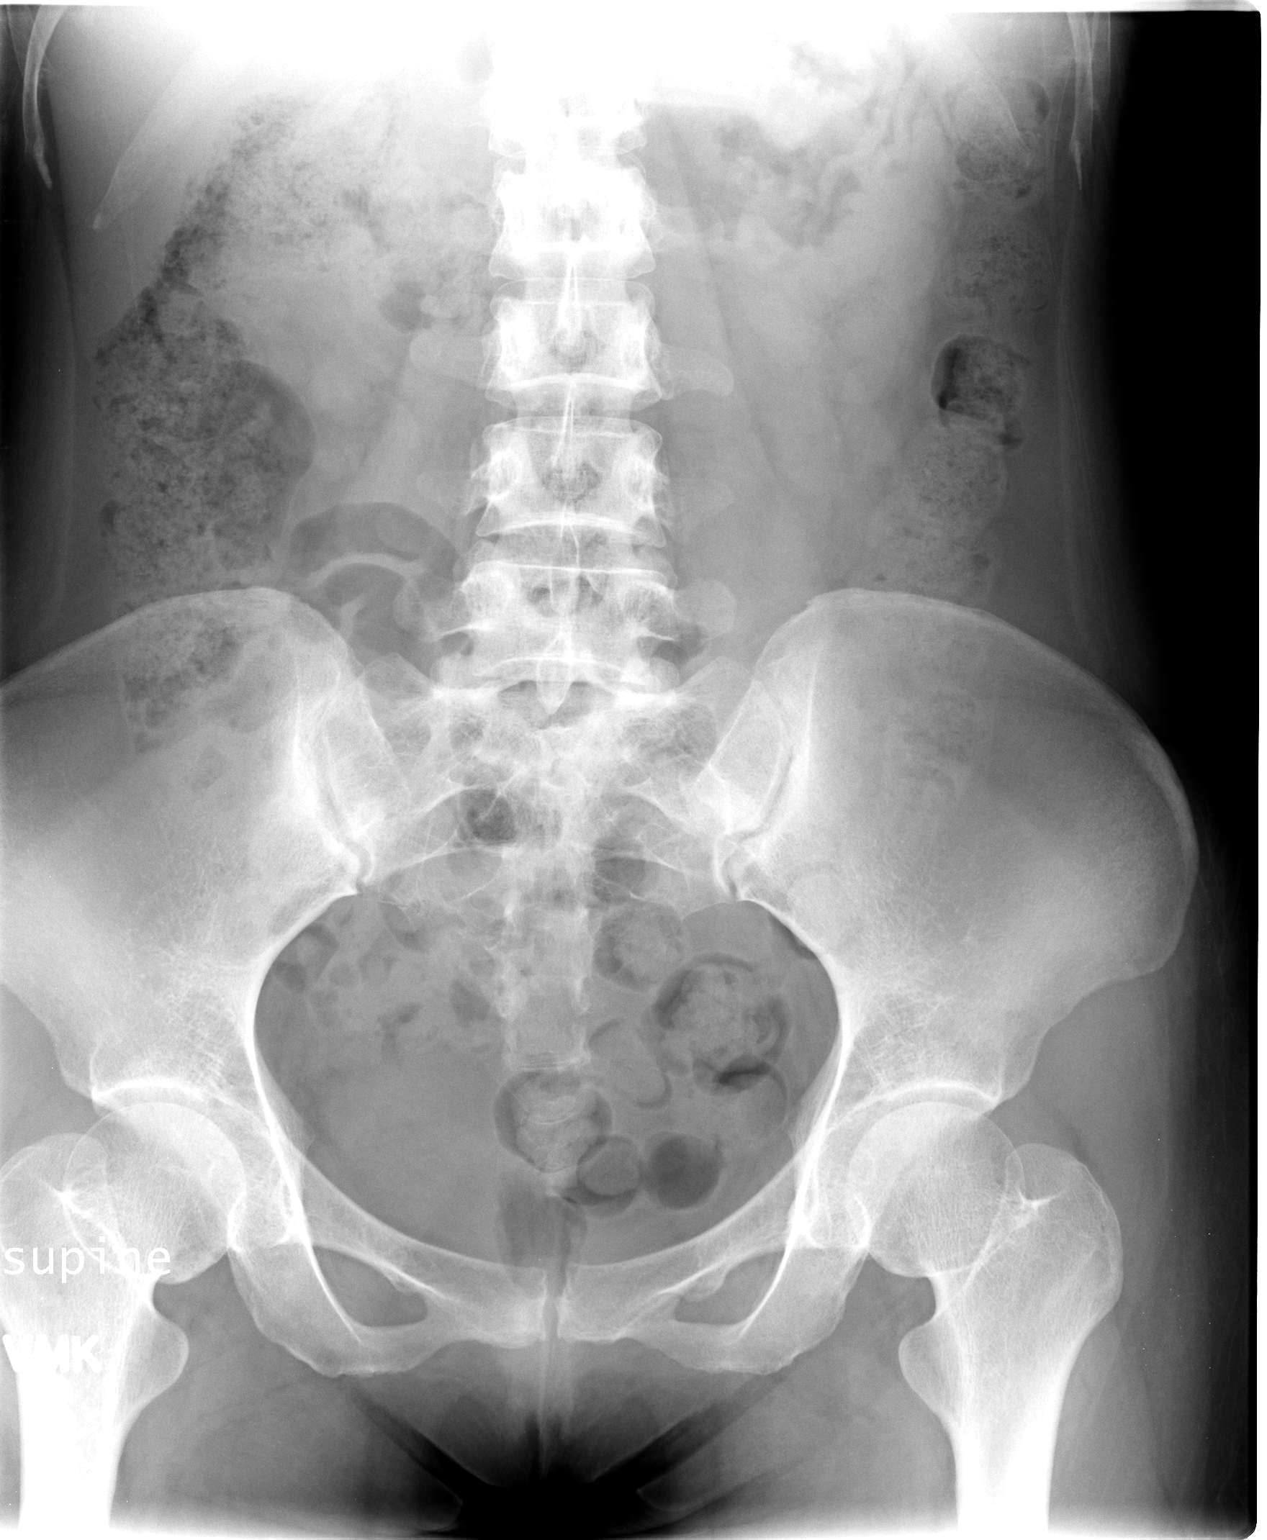

[view not recorded (2 of 2)]
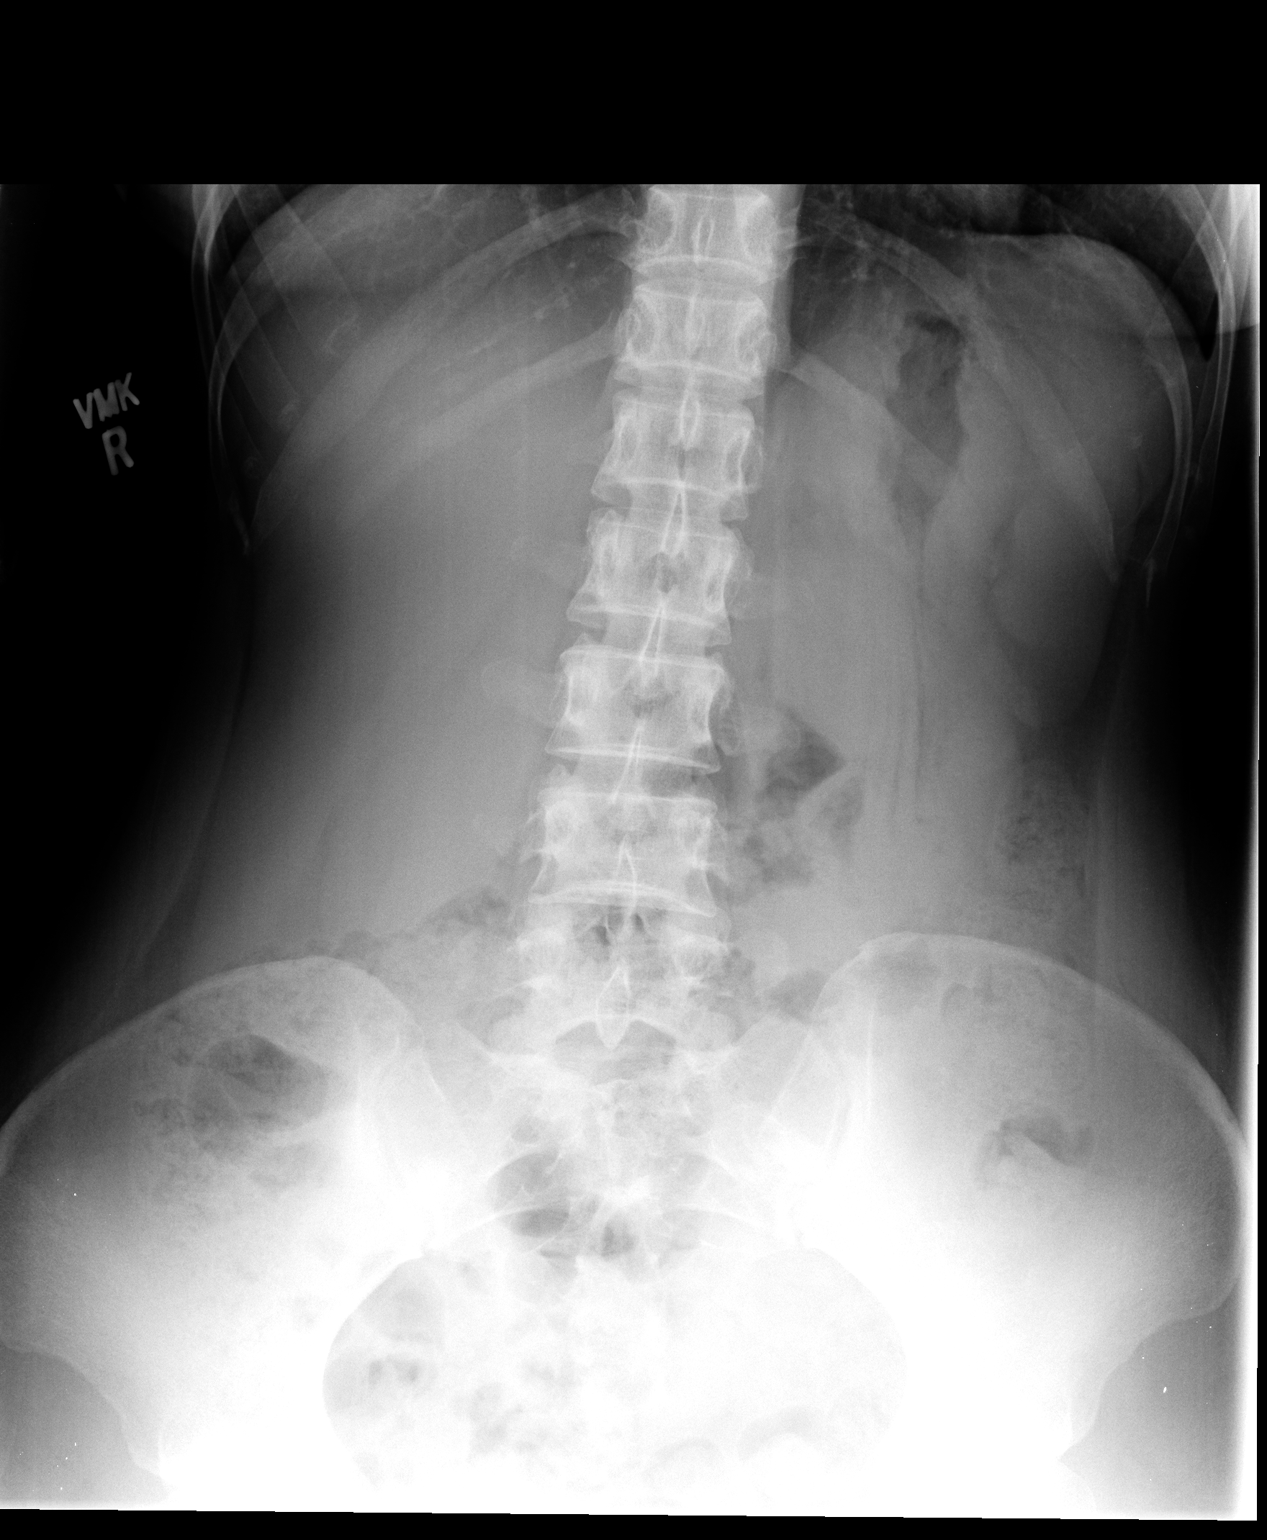

[2 of 2 positions shown; findings below may reference images not displayed]

FINDINGS: There is no free air or free fluid. Bowel gas pattern is normal. No
dilated bowel. No worrisome abdominal calcifications. Osseous
structures are normal.
IMPRESSION: Benign appearing abdomen.

## 2016-03-13 DIAGNOSIS — Z01419 Encounter for gynecological examination (general) (routine) without abnormal findings: Secondary | ICD-10-CM | POA: Diagnosis not present

## 2016-03-13 DIAGNOSIS — Z6822 Body mass index (BMI) 22.0-22.9, adult: Secondary | ICD-10-CM | POA: Diagnosis not present

## 2016-03-13 DIAGNOSIS — R222 Localized swelling, mass and lump, trunk: Secondary | ICD-10-CM | POA: Diagnosis not present

## 2016-03-13 DIAGNOSIS — Z1389 Encounter for screening for other disorder: Secondary | ICD-10-CM | POA: Diagnosis not present

## 2016-03-13 DIAGNOSIS — N63 Unspecified lump in breast: Secondary | ICD-10-CM | POA: Diagnosis not present

## 2016-03-22 DIAGNOSIS — D1720 Benign lipomatous neoplasm of skin and subcutaneous tissue of unspecified limb: Secondary | ICD-10-CM

## 2016-03-22 HISTORY — DX: Benign lipomatous neoplasm of skin and subcutaneous tissue of unspecified limb: D17.20

## 2016-03-25 ENCOUNTER — Ambulatory Visit: Payer: Self-pay | Admitting: Surgery

## 2016-03-25 DIAGNOSIS — D1720 Benign lipomatous neoplasm of skin and subcutaneous tissue of unspecified limb: Secondary | ICD-10-CM | POA: Diagnosis not present

## 2016-03-25 NOTE — H&P (Signed)
Haley Frazier 03/25/2016 1:49 PM Location: [facility] Patient #: [id number] DOB: Nov 20, 1981 Married / Language: English / Race: Asian Female  History of Present Illness Haley Frazier [name] MD; 03/25/2016 2:13 PM) Patient words: axilla mass    Patient sent at the request of Dr.Banga for bilateral axillary masses. These been present for a number of years. They are getting larger. Location is bilateral axilla. There are soft and nontender. There's been no redness or drainage.  The patient is a 35 year old female.   Other Problems Haley Frazier, [place]; 03/25/2016 1:51 PM) No pertinent past medical history  Past Surgical History Haley Frazier, [place]; 03/25/2016 1:51 PM) No pertinent past surgical history  Diagnostic Studies History Haley Frazier, [place]; 03/25/2016 1:51 PM) Colonoscopy never Mammogram within last year Pap Smear 1-5 years ago  Allergies Haley Frazier, CMA; 03/25/2016 1:51 PM) No Known Drug Allergies 03/25/2016  Medication History Haley Frazier, [place]; 03/25/2016 1:51 PM) Loratadine (10MG  Tablet, Oral) Active. Medications Reconciled  Social History Haley Frazier, [place]; 03/25/2016 1:51 PM) Caffeine use Coffee, Tea. No alcohol use No drug use Tobacco use Never smoker.  Pregnancy / Birth History Haley Frazier, [place]; 03/25/2016 1:51 PM) Age at menarche 63 years. Gravida 3 Maternal age 63-30 Para 2 Regular periods     Review of Systems Haley Frazier [name] CMA; 03/25/2016 1:51 PM) General Not Present- Appetite Loss, Chills, Fatigue, Fever, Night Sweats, Weight Gain and Weight Loss. Skin Not Present- Change in Wart/Mole, Dryness, Hives, Jaundice, New Lesions, Non-Healing Wounds, Rash and Ulcer. HEENT Not Present- Earache, Hearing Loss, Hoarseness, Nose Bleed, Oral Ulcers, Ringing in the Ears, Seasonal Allergies, Sinus Pain, Sore Throat, Visual Disturbances, Wears glasses/contact lenses and Yellow Eyes. Respiratory Not  Present- Bloody sputum, Chronic Cough, Difficulty Breathing, Snoring and Wheezing. Breast Not Present- Breast Mass, Breast Pain, Nipple Discharge and Skin Changes. Cardiovascular Not Present- Chest Pain, Difficulty Breathing Lying Down, Leg Cramps, Palpitations, Rapid Heart Rate, Shortness of Breath and Swelling of Extremities. Gastrointestinal Not Present- Abdominal Pain, Bloating, Bloody Stool, Change in Bowel Habits, Chronic diarrhea, Constipation, Difficulty Swallowing, Excessive gas, Gets full quickly at meals, Hemorrhoids, Indigestion, Nausea, Rectal Pain and Vomiting. Female Genitourinary Present- Frequency. Not Present- Nocturia, Painful Urination, Pelvic Pain and Urgency. Musculoskeletal Not Present- Back Pain, Joint Pain, Joint Stiffness, Muscle Pain, Muscle Weakness and Swelling of Extremities. Neurological Not Present- Decreased Memory, Fainting, Headaches, Numbness, Seizures, Tingling, Tremor, Trouble walking and Weakness. Psychiatric Not Present- Anxiety, Bipolar, Change in Sleep Pattern, Depression, Fearful and Frequent crying. Endocrine Not Present- Cold Intolerance, Excessive Hunger, Hair Changes, Heat Intolerance, Hot flashes and New Diabetes. Hematology Not Present- Easy Bruising, Excessive bleeding, Gland problems, HIV and Persistent Infections.  Vitals [employer] [name] CMA; 03/25/2016 1:50 PM) 03/25/2016 1:49 PM Weight: 119.38 lb Height: 63in Body Surface Area: 1.55 m Body Mass Index: 21.15 kg/m  BP: 110/72 (Sitting, Left Arm, Standard)      Physical Exam ([name] [name] MD; 03/25/2016 2:14 PM)  General Mental Status-Alert. General Appearance-Consistent with stated age. Hydration-Well hydrated. Voice-Normal.  Integumentary Note: Bilateral axilla shows subcutaneous lipoma which are mobile. Each are about 4 cm in maximal diameter.  Head and Neck Head-normocephalic, atraumatic with no lesions or palpable  masses. Trachea-midline. Thyroid Gland Characteristics - normal size and consistency.  Chest and Lung Exam Chest and lung exam reveals -quiet, even and easy respiratory effort with no use of accessory muscles and on auscultation, normal breath sounds, no adventitious sounds and normal vocal resonance. Inspection  Chest Wall - Normal. Back - normal.  Cardiovascular Cardiovascular examination reveals -normal heart sounds, regular rate and rhythm with no murmurs and normal pedal pulses bilaterally.  Neurologic Neurologic evaluation reveals -alert and oriented x 3 with no impairment of recent or remote memory. Mental Status-Normal.  Lymphatic Head & Neck  General Head & Neck Lymphatics: Bilateral - Description - Normal. Axillary  General Axillary Region: Bilateral - Description - Normal. Tenderness - Non Tender.    Assessment & Plan ([name] A. 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 The pros and cons of each as well as complications of surgery discussed with the patient and family today. Risk of bleeding, infection, poor wound healing, scar formation, contracture, injury to neighboring nerves arteries or veins, and the need further operative procedures discussed. She wishes to proceed.  Current Plans The pathophysiology of skin & subcutaneous masses was discussed. Natural history risks without surgery were discussed. I recommended surgery to remove the mass. I explained the technique of removal with use of local anesthesia & possible need for more aggressive sedation/anesthesia for patient comfort.  Risks such as bleeding, infection, wound breakdown, heart attack, death, and other risks were discussed. I noted a good likelihood this will help address the problem. Possibility that this will not correct all symptoms was explained. Possibility of regrowth/recurrence of the mass was discussed. We will work to  minimize complications. Questions were answered. The patient expresses understanding & wishes to proceed with surgery.  Pt Education - CCS Free Text Education/Instructions: discussed with patient and provided information. Pt Education - Benign Tumors: discussed with patient and provided information.

## 2016-04-14 ENCOUNTER — Encounter (HOSPITAL_BASED_OUTPATIENT_CLINIC_OR_DEPARTMENT_OTHER): Payer: Self-pay | Admitting: *Deleted

## 2016-04-22 ENCOUNTER — Ambulatory Visit (HOSPITAL_BASED_OUTPATIENT_CLINIC_OR_DEPARTMENT_OTHER): Payer: 59 | Admitting: Anesthesiology

## 2016-04-22 ENCOUNTER — Encounter (HOSPITAL_BASED_OUTPATIENT_CLINIC_OR_DEPARTMENT_OTHER)
Admission: RE | Disposition: A | Discharge status: Home / Self Care | Payer: Self-pay | Source: Ambulatory Visit | Attending: Surgery

## 2016-04-22 ENCOUNTER — Encounter (HOSPITAL_BASED_OUTPATIENT_CLINIC_OR_DEPARTMENT_OTHER): Payer: Self-pay | Admitting: Anesthesiology

## 2016-04-22 ENCOUNTER — Ambulatory Visit (HOSPITAL_BASED_OUTPATIENT_CLINIC_OR_DEPARTMENT_OTHER)
Admission: RE | Admit: 2016-04-22 | Discharge: 2016-04-22 | Disposition: A | Discharge status: Home / Self Care | Payer: 59 | Source: Ambulatory Visit | Attending: Surgery | Admitting: Surgery

## 2016-04-22 DIAGNOSIS — D1722 Benign lipomatous neoplasm of skin and subcutaneous tissue of left arm: Secondary | ICD-10-CM | POA: Diagnosis not present

## 2016-04-22 DIAGNOSIS — D179 Benign lipomatous neoplasm, unspecified: Secondary | ICD-10-CM | POA: Diagnosis not present

## 2016-04-22 DIAGNOSIS — D1739 Benign lipomatous neoplasm of skin and subcutaneous tissue of other sites: Secondary | ICD-10-CM | POA: Diagnosis not present

## 2016-04-22 DIAGNOSIS — D1721 Benign lipomatous neoplasm of skin and subcutaneous tissue of right arm: Secondary | ICD-10-CM | POA: Diagnosis not present

## 2016-04-22 HISTORY — DX: Benign lipomatous neoplasm of skin and subcutaneous tissue of unspecified limb: D17.20

## 2016-04-22 HISTORY — PX: LIPOMA EXCISION: SHX5283

## 2016-04-22 SURGERY — EXCISION LIPOMA
Anesthesia: General | Site: Axilla | Laterality: Bilateral

## 2016-04-22 MED ORDER — MIDAZOLAM HCL 2 MG/2ML IJ SOLN
1.0000 mg | INTRAMUSCULAR | Status: DC | PRN
Start: 2016-04-22 — End: 2016-04-22
  Administered 2016-04-22: 2 mg via INTRAVENOUS

## 2016-04-22 MED ORDER — BUPIVACAINE-EPINEPHRINE 0.25% -1:200000 IJ SOLN
INTRAMUSCULAR | Status: DC | PRN
  Administered 2016-04-22: 19 mL

## 2016-04-22 MED ORDER — HYDROCODONE-ACETAMINOPHEN 7.5-325 MG PO TABS: 1.0000 | ORAL_TABLET | Freq: Once | ORAL | Status: DC | PRN

## 2016-04-22 MED ORDER — FENTANYL CITRATE (PF) 100 MCG/2ML IJ SOLN
50.0000 ug | INTRAMUSCULAR | Status: AC | PRN
  Administered 2016-04-22 (×2): 25 ug via INTRAVENOUS
  Administered 2016-04-22: 100 ug via INTRAVENOUS

## 2016-04-22 MED ORDER — DEXTROSE 5 % IV SOLN
3.0000 g | INTRAVENOUS | Status: AC
  Administered 2016-04-22: 2 g via INTRAVENOUS

## 2016-04-22 MED ORDER — FENTANYL CITRATE (PF) 100 MCG/2ML IJ SOLN: 25.0000 ug | INTRAMUSCULAR | Status: DC | PRN

## 2016-04-22 MED ORDER — PROPOFOL 10 MG/ML IV BOLUS
INTRAVENOUS | Status: AC
  Filled 2016-04-22: qty 20

## 2016-04-22 MED ORDER — PROPOFOL 10 MG/ML IV BOLUS
INTRAVENOUS | Status: DC | PRN
  Administered 2016-04-22: 150 mg via INTRAVENOUS

## 2016-04-22 MED ORDER — LIDOCAINE HCL (CARDIAC) 20 MG/ML IV SOLN
INTRAVENOUS | Status: DC | PRN
  Administered 2016-04-22: 60 mg via INTRAVENOUS

## 2016-04-22 MED ORDER — LACTATED RINGERS IV SOLN
INTRAVENOUS | Status: DC
  Administered 2016-04-22: 10:00:00 via INTRAVENOUS

## 2016-04-22 MED ORDER — MIDAZOLAM HCL 2 MG/2ML IJ SOLN
INTRAMUSCULAR | Status: AC
  Filled 2016-04-22: qty 2

## 2016-04-22 MED ORDER — CHLORHEXIDINE GLUCONATE 4 % EX LIQD: 1.0000 "application " | Freq: Once | CUTANEOUS | Status: DC

## 2016-04-22 MED ORDER — HYDROCODONE-ACETAMINOPHEN 5-325 MG PO TABS: 1.0000 | ORAL_TABLET | Freq: Four times a day (QID) | ORAL | Status: DC | PRN

## 2016-04-22 MED ORDER — FENTANYL CITRATE (PF) 100 MCG/2ML IJ SOLN
INTRAMUSCULAR | Status: AC
  Filled 2016-04-22: qty 2

## 2016-04-22 MED ORDER — CEFAZOLIN SODIUM-DEXTROSE 2-4 GM/100ML-% IV SOLN
INTRAVENOUS | Status: AC
  Filled 2016-04-22: qty 100

## 2016-04-22 MED ORDER — METOCLOPRAMIDE HCL 5 MG/ML IJ SOLN: 10.0000 mg | Freq: Once | INTRAMUSCULAR | Status: DC | PRN

## 2016-04-22 MED ORDER — MEPERIDINE HCL 25 MG/ML IJ SOLN: 6.2500 mg | INTRAMUSCULAR | Status: DC | PRN

## 2016-04-22 MED ORDER — DEXAMETHASONE SODIUM PHOSPHATE 4 MG/ML IJ SOLN
INTRAMUSCULAR | Status: DC | PRN
  Administered 2016-04-22: 10 mg via INTRAVENOUS

## 2016-04-22 MED ORDER — GLYCOPYRROLATE 0.2 MG/ML IJ SOLN: 0.2000 mg | Freq: Once | INTRAMUSCULAR | Status: DC | PRN

## 2016-04-22 MED ORDER — SCOPOLAMINE 1 MG/3DAYS TD PT72: 1.0000 | MEDICATED_PATCH | Freq: Once | TRANSDERMAL | Status: DC | PRN

## 2016-04-22 MED ORDER — ONDANSETRON HCL 4 MG/2ML IJ SOLN
INTRAMUSCULAR | Status: DC | PRN
  Administered 2016-04-22: 4 mg via INTRAVENOUS

## 2016-04-22 MED ORDER — TRAMADOL HCL 50 MG PO TABS: 50.0000 mg | ORAL_TABLET | Freq: Four times a day (QID) | ORAL | Status: DC | PRN

## 2016-04-22 SURGICAL SUPPLY — 47 items
BENZOIN TINCTURE PRP APPL 2/3 (GAUZE/BANDAGES/DRESSINGS) IMPLANT
BINDER BREAST MEDIUM (GAUZE/BANDAGES/DRESSINGS) ×3 IMPLANT
BLADE SURG 10 STRL SS (BLADE) IMPLANT
BLADE SURG 15 STRL LF DISP TIS (BLADE) ×1 IMPLANT
BLADE SURG 15 STRL SS (BLADE) ×2
CANISTER SUCT 1200ML W/VALVE (MISCELLANEOUS) IMPLANT
CHLORAPREP W/TINT 26ML (MISCELLANEOUS) ×3 IMPLANT
CLOSURE WOUND 1/2 X4 (GAUZE/BANDAGES/DRESSINGS)
COVER BACK TABLE 60X90IN (DRAPES) ×3 IMPLANT
COVER MAYO STAND STRL (DRAPES) ×3 IMPLANT
DECANTER SPIKE VIAL GLASS SM (MISCELLANEOUS) IMPLANT
DRAPE LAPAROTOMY 100X72 PEDS (DRAPES) ×3 IMPLANT
DRAPE UTILITY XL STRL (DRAPES) ×3 IMPLANT
ELECT COATED BLADE 2.86 ST (ELECTRODE) ×3 IMPLANT
ELECT REM PT RETURN 9FT ADLT (ELECTROSURGICAL) ×3
ELECTRODE REM PT RTRN 9FT ADLT (ELECTROSURGICAL) ×1 IMPLANT
GLOVE BIO SURGEON STRL SZ 6.5 (GLOVE) ×2 IMPLANT
GLOVE BIO SURGEONS STRL SZ 6.5 (GLOVE) ×1
GLOVE BIOGEL PI IND STRL 7.0 (GLOVE) ×1 IMPLANT
GLOVE BIOGEL PI IND STRL 8 (GLOVE) ×1 IMPLANT
GLOVE BIOGEL PI INDICATOR 7.0 (GLOVE) ×2
GLOVE BIOGEL PI INDICATOR 8 (GLOVE) ×2
GLOVE ECLIPSE 8.0 STRL XLNG CF (GLOVE) ×3 IMPLANT
GLOVE SURG SS PI 6.5 STRL IVOR (GLOVE) ×3 IMPLANT
GOWN STRL REUS W/ TWL LRG LVL3 (GOWN DISPOSABLE) ×2 IMPLANT
GOWN STRL REUS W/ TWL XL LVL3 (GOWN DISPOSABLE) ×1 IMPLANT
GOWN STRL REUS W/TWL LRG LVL3 (GOWN DISPOSABLE) ×4
GOWN STRL REUS W/TWL XL LVL3 (GOWN DISPOSABLE) ×2
LIQUID BAND (GAUZE/BANDAGES/DRESSINGS) IMPLANT
NEEDLE HYPO 25X1 1.5 SAFETY (NEEDLE) ×3 IMPLANT
NS IRRIG 1000ML POUR BTL (IV SOLUTION) IMPLANT
PACK BASIN DAY SURGERY FS (CUSTOM PROCEDURE TRAY) ×3 IMPLANT
PENCIL BUTTON HOLSTER BLD 10FT (ELECTRODE) ×3 IMPLANT
SLEEVE SCD COMPRESS KNEE MED (MISCELLANEOUS) ×3 IMPLANT
SPONGE LAP 4X18 X RAY DECT (DISPOSABLE) IMPLANT
STAPLER VISISTAT 35W (STAPLE) IMPLANT
STRIP CLOSURE SKIN 1/2X4 (GAUZE/BANDAGES/DRESSINGS) IMPLANT
SUT MNCRL AB 4-0 PS2 18 (SUTURE) ×6 IMPLANT
SUT MON AB 4-0 PC3 18 (SUTURE) ×3 IMPLANT
SUT VICRYL 3-0 CR8 SH (SUTURE) ×6 IMPLANT
SUT VICRYL AB 3 0 TIES (SUTURE) IMPLANT
SYR CONTROL 10ML LL (SYRINGE) ×3 IMPLANT
TOWEL OR 17X24 6PK STRL BLUE (TOWEL DISPOSABLE) ×6 IMPLANT
TOWEL OR NON WOVEN STRL DISP B (DISPOSABLE) ×3 IMPLANT
TUBE CONNECTING 20'X1/4 (TUBING) ×1
TUBE CONNECTING 20X1/4 (TUBING) ×2 IMPLANT
YANKAUER SUCT BULB TIP NO VENT (SUCTIONS) ×3 IMPLANT

## 2016-04-22 NOTE — H&P (View-Only) (Signed)
Haley Frazier 03/25/2016 1:49 PM Location: [facility] Patient #: [id number] DOB: [birth year] Married / Language: English / Race: Asian Female  History of Present Illness [name] A. Dena Esperanza MD; 03/25/2016 2:13 PM) Patient words: axilla mass    Patient sent at the request of Dr.Banga for bilateral axillary masses. These been present for a number of years. They are getting larger. Location is bilateral axilla. There are soft and nontender. There's been no redness or drainage.  The patient is a 35 year old female.   Other Problems [name], [place]; 03/25/2016 1:51 PM) No pertinent past medical history  Past Surgical History [name], [place]; 03/25/2016 1:51 PM) No pertinent past surgical history  Diagnostic Studies History [name], [place]; 03/25/2016 1:51 PM) Colonoscopy never Mammogram within last year Pap Smear 1-5 years ago  Allergies [name], CMA; 03/25/2016 1:51 PM) No Known Drug Allergies 03/25/2016  Medication History [name], [place]; 03/25/2016 1:51 PM) Loratadine (10MG  Tablet, Oral) Active. Medications Reconciled  Social History [name], [place]; 03/25/2016 1:51 PM) Caffeine use Coffee, Tea. No alcohol use No drug use Tobacco use Never smoker.  Pregnancy / Birth History [name], [place]; 03/25/2016 1:51 PM) Age at menarche 37 years. Gravida 3 Maternal age 56-30 Para 2 Regular periods     Review of Systems [name] [name] CMA; 03/25/2016 1:51 PM) General Not Present- Appetite Loss, Chills, Fatigue, Fever, Night Sweats, Weight Gain and Weight Loss. Skin Not Present- Change in Wart/Mole, Dryness, Hives, Jaundice, New Lesions, Non-Healing Wounds, Rash and Ulcer. HEENT Not Present- Earache, Hearing Loss, Hoarseness, Nose Bleed, Oral Ulcers, Ringing in the Ears, Seasonal Allergies, Sinus Pain, Sore Throat, Visual Disturbances, Wears glasses/contact lenses and Yellow Eyes. Respiratory Not  Present- Bloody sputum, Chronic Cough, Difficulty Breathing, Snoring and Wheezing. Breast Not Present- Breast Mass, Breast Pain, Nipple Discharge and Skin Changes. Cardiovascular Not Present- Chest Pain, Difficulty Breathing Lying Down, Leg Cramps, Palpitations, Rapid Heart Rate, Shortness of Breath and Swelling of Extremities. Gastrointestinal Not Present- Abdominal Pain, Bloating, Bloody Stool, Change in Bowel Habits, Chronic diarrhea, Constipation, Difficulty Swallowing, Excessive gas, Gets full quickly at meals, Hemorrhoids, Indigestion, Nausea, Rectal Pain and Vomiting. Female Genitourinary Present- Frequency. Not Present- Nocturia, Painful Urination, Pelvic Pain and Urgency. Musculoskeletal Not Present- Back Pain, Joint Pain, Joint Stiffness, Muscle Pain, Muscle Weakness and Swelling of Extremities. Neurological Not Present- Decreased Memory, Fainting, Headaches, Numbness, Seizures, Tingling, Tremor, Trouble walking and Weakness. Psychiatric Not Present- Anxiety, Bipolar, Change in Sleep Pattern, Depression, Fearful and Frequent crying. Endocrine Not Present- Cold Intolerance, Excessive Hunger, Hair Changes, Heat Intolerance, Hot flashes and New Diabetes. Hematology Not Present- Easy Bruising, Excessive bleeding, Gland problems, HIV and Persistent Infections.  Vitals [employer] [name] CMA; 03/25/2016 1:50 PM) 03/25/2016 1:49 PM Weight: 119.38 lb Height: 63in Body Surface Area: 1.55 m Body Mass Index: 21.15 kg/m  BP: 110/72 (Sitting, Left Arm, Standard)      Physical Exam (Jaaziel Peatross A. Sheryl Saintil MD; 03/25/2016 2:14 PM)  General Mental Status-Alert. General Appearance-Consistent with stated age. Hydration-Well hydrated. Voice-Normal.  Integumentary Note: Bilateral axilla shows subcutaneous lipoma which are mobile. Each are about 4 cm in maximal diameter.  Head and Neck Head-normocephalic, atraumatic with no lesions or palpable  masses. Trachea-midline. Thyroid Gland Characteristics - normal size and consistency.  Chest and Lung Exam Chest and lung exam reveals -quiet, even and easy respiratory effort with no use of accessory muscles and on auscultation, normal breath sounds, no adventitious sounds and normal vocal resonance. Inspection  Chest Wall - Normal. Back - normal.  Cardiovascular Cardiovascular examination reveals -normal heart sounds, regular rate and rhythm with no murmurs and normal pedal pulses bilaterally.  Neurologic Neurologic evaluation reveals -alert and oriented x 3 with no impairment of recent or remote memory. Mental Status-Normal.  Lymphatic Head & Neck  General Head & Neck Lymphatics: Bilateral - Description - Normal. Axillary  General Axillary Region: Bilateral - Description - Normal. Tenderness - Non Tender.    Assessment & Plan (Candler Ginsberg A. 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 The pros and cons of each as well as complications of surgery discussed with the patient and family today. Risk of bleeding, infection, poor wound healing, scar formation, contracture, injury to neighboring nerves arteries or veins, and the need further operative procedures discussed. She wishes to proceed.  Current Plans The pathophysiology of skin & subcutaneous masses was discussed. Natural history risks without surgery were discussed. I recommended surgery to remove the mass. I explained the technique of removal with use of local anesthesia & possible need for more aggressive sedation/anesthesia for patient comfort.  Risks such as bleeding, infection, wound breakdown, heart attack, death, and other risks were discussed. I noted a good likelihood this will help address the problem. Possibility that this will not correct all symptoms was explained. Possibility of regrowth/recurrence of the mass was discussed. We will work to  minimize complications. Questions were answered. The patient expresses understanding & wishes to proceed with surgery.  Pt Education - CCS Free Text Education/Instructions: discussed with patient and provided information. Pt Education - Benign Tumors: discussed with patient and provided information.

## 2016-04-22 NOTE — Discharge Instructions (Signed)
GENERAL SURGERY: POST OP INSTRUCTIONS ° °1. DIET: Follow a light bland diet the first 24 hours after arrival home, such as soup, liquids, crackers, etc.  Be sure to include lots of fluids daily.  Avoid fast food or heavy meals as your are more likely to get nauseated.   °2. Take your usually prescribed home medications unless otherwise directed. °3. PAIN CONTROL: °a. Pain is best controlled by a usual combination of three different methods TOGETHER: °i. Ice/Heat °ii. Over the counter pain medication °iii. Prescription pain medication °b. Most patients will experience some swelling and bruising around the incisions.  Ice packs or heating pads (30-60 minutes up to 6 times a day) will help. Use ice for the first few days to help decrease swelling and bruising, then switch to heat to help relax tight/sore spots and speed recovery.  Some people prefer to use ice alone, heat alone, alternating between ice & heat.  Experiment to what works for you.  Swelling and bruising can take several weeks to resolve.   °c. It is helpful to take an over-the-counter pain medication regularly for the first few weeks.  Choose one of the following that works best for you: °i. Naproxen (Aleve, etc)  Two 220mg tabs twice a day °ii. Ibuprofen (Advil, etc) Three 200mg tabs four times a day (every meal & bedtime) °iii. Acetaminophen (Tylenol, etc) 500-650mg four times a day (every meal & bedtime) °d. A  prescription for pain medication (such as oxycodone, hydrocodone, etc) should be given to you upon discharge.  Take your pain medication as prescribed.  °i. If you are having problems/concerns with the prescription medicine (does not control pain, nausea, vomiting, rash, itching, etc), please call us [phone] to see if we need to switch you to a different pain medicine that will work better for you and/or control your side effect better. °ii. If you need a refill on your pain medication, please contact your pharmacy.  They will contact our  office to request authorization. Prescriptions will not be filled after 5 pm or on week-ends. °4. Avoid getting constipated.  Between the surgery and the pain medications, it is common to experience some constipation.  Increasing fluid intake and taking a fiber supplement (such as Metamucil, Citrucel, FiberCon, MiraLax, etc) 1-2 times a day regularly will usually help prevent this problem from occurring.  A mild laxative (prune juice, Milk of Magnesia, MiraLax, etc) should be taken according to package directions if there are no bowel movements after 48 hours.   °5. Wash / shower every day.  You may shower over the dressings as they are waterproof.  Continue to shower over incision(s) after the dressing is off. °6. Remove your waterproof bandages 5 days after surgery.  You may leave the incision open to air.  You may have skin tapes (Steri Strips) covering the incision(s).  Leave them on until one week, then remove.  You may replace a dressing/Band-Aid to cover the incision for comfort if you wish.  ° ° ° ° °7. ACTIVITIES as tolerated:   °a. You may resume regular (light) daily activities beginning the next day--such as daily self-care, walking, climbing stairs--gradually increasing activities as tolerated.  If you can walk 30 minutes without difficulty, it is safe to try more intense activity such as jogging, treadmill, bicycling, low-impact aerobics, swimming, etc. °b. Save the most intensive and strenuous activity for last such as sit-ups, heavy lifting, contact sports, etc  Refrain from any heavy lifting or straining until you   are off narcotics for pain control.   c. DO NOT PUSH THROUGH PAIN.  Let pain be your guide: If it hurts to do something, don't do it.  Pain is your body warning you to avoid that activity for another week until the pain goes down. d. You may drive when you are no longer taking prescription pain medication, you can comfortably wear a seatbelt, and you can safely maneuver your car and  apply brakes. e. Dennis Bast may have sexual intercourse when it is comfortable.  8. FOLLOW UP in our office a. Please call [facility] at [phone] to set up an appointment to see your surgeon in the office for a follow-up appointment approximately 2-3 weeks after your surgery. b. Make sure that you call for this appointment the day you arrive home to insure a convenient appointment time. 9. IF YOU HAVE DISABILITY OR FAMILY LEAVE FORMS, BRING THEM TO THE OFFICE FOR PROCESSING.  DO NOT GIVE THEM TO YOUR DOCTOR.   WHEN TO CALL us [phone]: 1. Poor pain control 2. Reactions / problems with new medications (rash/itching, nausea, etc)  3. Fever over 101.5 F (38.5 C) 4. Worsening swelling or bruising 5. Continued bleeding from incision. 6. Increased pain, redness, or drainage from the incision 7. Difficulty breathing / swallowing   The clinic staff is available to answer your questions during regular business hours (8:30am-5pm).  Please dont hesitate to call and ask to speak to one of our nurses for clinical concerns.   If you have a medical emergency, go to the nearest emergency room or call 911.  A surgeon from [facility] is always on call at the [facility], [place], [place], [place], [place]  [id number] ? MAIN: [phone] ? TOLL FREE: [phone] ?  FAX [phone] [email or web address]    Can wear a sports bra if more comfortable   Keep ice on areas as much as possible for the next 2 - 3 day Ok to lift, shower, and drive.   Follow up 3 weeks    Post Anesthesia Home Care Instructions  Activity: Get plenty of rest for the remainder of the day. A responsible adult should stay with you for 24 hours following the procedure.  For the next 24 hours, DO NOT: -Drive a car -Paediatric nurse -Drink alcoholic beverages -Take any medication unless instructed by your physician -Make any legal decisions or sign  important papers.  Meals: Start with liquid foods such as gelatin or soup. Progress to regular foods as tolerated. Avoid greasy, spicy, heavy foods. If nausea and/or vomiting occur, drink only clear liquids until the nausea and/or vomiting subsides. Call your physician if vomiting continues.  Special Instructions/Symptoms: Your throat may feel dry or sore from the anesthesia or the breathing tube placed in your throat during surgery. If this causes discomfort, gargle with warm salt water. The discomfort should disappear within 24 hours.  If you had a scopolamine patch placed behind your ear for the management of post- operative nausea and/or vomiting:  1. The medication in the patch is effective for 72 hours, after which it should be removed.  Wrap patch in a tissue and discard in the trash. Wash hands thoroughly with soap and water. 2. You may remove the patch earlier than 72 hours if you experience unpleasant side effects which may include dry mouth, dizziness or visual disturbances. 3. Avoid touching the patch. Wash your hands with soap and water after contact  patch. °  ° ° °

## 2016-04-22 NOTE — Anesthesia Preprocedure Evaluation (Addendum)
Anesthesia Evaluation  Patient identified by MRN, date of birth, ID band Patient awake    Reviewed: Allergy & Precautions, NPO status , Patient's Chart, lab work & pertinent test results  Airway Mallampati: II  TM Distance: >3 FB     Dental  (+) Teeth Intact   Pulmonary neg pulmonary ROS,    Pulmonary exam normal breath sounds clear to auscultation       Cardiovascular negative cardio ROS Normal cardiovascular exam Rhythm:Regular Rate:Normal     Neuro/Psych negative neurological ROS  negative psych ROS   GI/Hepatic negative GI ROS, Neg liver ROS,   Endo/Other    Renal/GU negative Renal ROS  negative genitourinary   Musculoskeletal Bilateral Axillary Lipomas   Abdominal   Peds  Hematology negative hematology ROS (+)   Anesthesia Other Findings   Reproductive/Obstetrics                             Anesthesia Physical Anesthesia Plan  ASA: I  Anesthesia Plan: General   Post-op Pain Management:    Induction: Intravenous  Airway Management Planned: LMA  Additional Equipment:   Intra-op Plan:   Post-operative Plan: Extubation in OR  Informed Consent: I have reviewed the patients History and Physical, chart, labs and discussed the procedure including the risks, benefits and alternatives for the proposed anesthesia with the patient or authorized representative who has indicated his/her understanding and acceptance.   Dental advisory given  Plan Discussed with: Anesthesiologist, CRNA and Surgeon  Anesthesia Plan Comments:         Anesthesia Quick Evaluation

## 2016-04-22 NOTE — Interval H&P Note (Signed)
History and Physical Interval Note:  04/22/2016 9:56 AM  Haley Frazier  has presented today for surgery, with the diagnosis of Lipoma  The various methods of treatment have been discussed with the patient and family. After consideration of risks, benefits and other options for treatment, the patient has consented to  Procedure(s): EXCISION LIPOMA BILATERAL AXILLA  (Bilateral) as a surgical intervention .  The patient's history has been reviewed, patient examined, no change in status, stable for surgery.  I have reviewed the patient's chart and labs.  Questions were answered to the patient's satisfaction.     [name]

## 2016-04-22 NOTE — Anesthesia Postprocedure Evaluation (Signed)
Anesthesia Post Note  Patient: Pharmacologist  Procedure(s) Performed: Procedure(s) (LRB): EXCISION LIPOMA BILATERAL AXILLA  (Bilateral)  Patient location during evaluation: PACU Anesthesia Type: General Level of consciousness: awake and alert and oriented Pain management: pain level controlled Vital Signs Assessment: post-procedure vital signs reviewed and stable Respiratory status: spontaneous breathing, nonlabored ventilation and respiratory function stable Cardiovascular status: blood pressure returned to baseline and stable Postop Assessment: no signs of nausea or vomiting Anesthetic complications: no    Last Vitals:  Filed Vitals:   04/22/16 1130 04/22/16 1145  BP: 103/67 108/69  Pulse: 66 72  Temp:    Resp: 18 22    Last Pain:  Filed Vitals:   04/22/16 1151  PainSc: 0-No pain                 Zuly Belkin A.

## 2016-04-22 NOTE — Transfer of Care (Signed)
Immediate Anesthesia Transfer of Care Note  Patient: Haley Frazier  Procedure(s) Performed: Procedure(s): EXCISION LIPOMA BILATERAL AXILLA  (Bilateral)  Patient Location: PACU  Anesthesia Type:General  Level of Consciousness: sedated  Airway & Oxygen Therapy: Patient Spontanous Breathing and Patient connected to face mask oxygen  Post-op Assessment: Report given to RN and Post -op Vital signs reviewed and stable  Post vital signs: Reviewed and stable  Last Vitals:  Filed Vitals:   04/22/16 0928  BP: 112/77  Pulse: 66  Temp: 36.7 C  Resp: 20    Last Pain: There were no vitals filed for this visit.       Complications: No apparent anesthesia complications

## 2016-04-22 NOTE — Anesthesia Procedure Notes (Signed)
Procedure Name: LMA Insertion Date/Time: 04/22/2016 10:09 AM Performed by: Maryella Shivers Pre-anesthesia Checklist: Patient identified, Emergency Drugs available, Suction available and Patient being monitored Patient Re-evaluated:Patient Re-evaluated prior to inductionOxygen Delivery Method: Circle System Utilized Preoxygenation: Pre-oxygenation with 100% oxygen Intubation Type: IV induction Ventilation: Mask ventilation without difficulty LMA: LMA inserted LMA Size: 4.0 Number of attempts: 1 Airway Equipment and Method: bite block Placement Confirmation: positive ETCO2 Tube secured with: Tape Dental Injury: Teeth and Oropharynx as per pre-operative assessment

## 2016-04-22 NOTE — Op Note (Signed)
Preoperative diagnosis: Bilateral axillary lipoma left measuring 5 x 6 cm right measuring 4 x 5 cm  Postoperative diagnosis: Same  Procedure: Excision of bilateral axillary lipoma  Surgeon: Erroll Luna M.D.  Anesthesia: LMA with 0.25% Sensorcaine local with epinephrine  EBL: Minimal  Specimens:  bilateral axillary masses and skin from the right to pathology  Drains: None  Indications for procedure: Patient presents with bilateral axillary masses. On examination these were fibrofatty in nature consistent with lipoma. The left side felt like it measured in the office about 4 cm as to the right side. She presents today for excision of these lipoma due to discomfort. Risk of bleeding, infection, cosmetic deformity, nerve injury, blood vessel injury, discussed with the patient. She voiced understanding and the above potential risk and agree to proceed.  Description of procedure: The patient was met in the holding area and questions are answered. She's taken to the operating room and placed upon the OR table with arms on arm boards. Her upper chest and bilateral lower prepped and draped in a sterile fashion after induction of general anesthesia. Timeout was done to verify proper patient and procedure. Both lipoma were visible in the axilla. The left side was done first. A curvilinear incision was made in the axilla. Dissection was carried all fatty tissue was removed and the area of the lipoma and sent to pathology. I did excise some skin given its redundancy so that was closed in a flat fashion. This was done with 3-0 Vicryl and 4-0 Monocryl. In a similar fashion, the right side was done. This measured 4 x 5 cm.. Skin was taken with lipoma on this side and the fatty tissue was skin were removed in its entirety. This facilitated closure on the right side to keep this flap. This was done with 3-0 Vicryl and 4-0 Monocryl. Liquid adhesive was applied to both sides. Breast Binder was placed for  compression. All final counts are found to be correct. The patient was awoke extubated taken to recovery in satisfactory condition.

## 2016-04-23 ENCOUNTER — Encounter (HOSPITAL_BASED_OUTPATIENT_CLINIC_OR_DEPARTMENT_OTHER): Payer: Self-pay | Admitting: Surgery

## 2016-05-22 ENCOUNTER — Other Ambulatory Visit: Payer: Self-pay | Admitting: Surgery

## 2016-05-22 DIAGNOSIS — M79601 Pain in right arm: Secondary | ICD-10-CM

## 2016-05-26 ENCOUNTER — Other Ambulatory Visit: Payer: 59

## 2016-06-02 ENCOUNTER — Ambulatory Visit
Admission: RE | Admit: 2016-06-02 | Discharge: 2016-06-02 | Disposition: A | Discharge status: Home / Self Care | Payer: 59 | Source: Ambulatory Visit | Attending: Surgery | Admitting: Surgery

## 2016-06-02 DIAGNOSIS — M79621 Pain in right upper arm: Secondary | ICD-10-CM | POA: Diagnosis not present

## 2016-06-02 DIAGNOSIS — M79601 Pain in right arm: Secondary | ICD-10-CM

## 2016-06-21 LAB — HM PAP SMEAR: HM PAP: NORMAL

## 2017-03-02 ENCOUNTER — Ambulatory Visit: Payer: 59 | Admitting: Primary Care

## 2017-03-02 ENCOUNTER — Ambulatory Visit: Payer: 59 | Admitting: Internal Medicine

## 2017-03-16 ENCOUNTER — Encounter: Payer: Self-pay | Admitting: Internal Medicine

## 2017-03-16 ENCOUNTER — Ambulatory Visit (INDEPENDENT_AMBULATORY_CARE_PROVIDER_SITE_OTHER): Payer: 59 | Admitting: Internal Medicine

## 2017-03-16 VITALS — BP 108/66 | HR 78 | Temp 98.6°F | Ht 62.0 in | Wt 118.8 lb

## 2017-03-16 DIAGNOSIS — R102 Pelvic and perineal pain: Secondary | ICD-10-CM | POA: Diagnosis not present

## 2017-03-16 NOTE — Progress Notes (Signed)
HPI  Pt presents to the clinic today to establish care. She has not a PCP in many years.   She reports intermittent pelvic pain. This started 10 years ago but she reports it is occurring more frequently now. She describes the pain as sharp and stabbing. She denies nausea, vomiting, urinary urgency, frequency or dysuria. Her bowels are moving normally. Eating does not affect the pain. The pain intensifies when she has intercourse. Her periods are regular, but much lighter after she has had her second child. She has tried a heating pad and Advil with some relief.   Flu: never Tetanus: 02/2012 Pap Smear: 06/2016, [facility] Dentist: as needed    Past Medical History:  Diagnosis Date  . Lipoma of axilla 03/2016   bilateral    Current Outpatient Prescriptions  Medication Sig Dispense Refill  . loratadine (CLARITIN) 10 MG tablet Take 1 tablet (10 mg total) by mouth daily. 30 tablet 11   No current facility-administered medications for this visit.     No Known Allergies  Family History  Problem Relation Age of Onset  . Hypertension Mother     Social History   Social History  . Marital status: Married    Spouse name: N/A  . Number of children: N/A  . Years of education: N/A   Occupational History  . Not on file.   Social History Main Topics  . Smoking status: Never Smoker  . Smokeless tobacco: Never Used  . Alcohol use No  . Drug use: No  . Sexual activity: Yes   Other Topics Concern  . Not on file   Social History Narrative  . No narrative on file    ROS:  Constitutional: Denies fever, malaise, fatigue, headache or abrupt weight changes.  HEENT: Denies eye pain, eye redness, ear pain, ringing in the ears, wax buildup, runny nose, nasal congestion, bloody nose, or sore throat. Respiratory: Denies difficulty breathing, shortness of breath, cough or sputum production.   Cardiovascular: Denies chest pain, chest tightness, palpitations or swelling in the hands or feet.   Gastrointestinal: Pt reports pelvic pain. Denies abdominal pain, bloating, constipation, diarrhea or blood in the stool.  GU: Denies frequency, urgency, pain with urination, blood in urine, odor or discharge. Musculoskeletal: Denies decrease in range of motion, difficulty with gait, muscle pain or joint pain and swelling.  Skin: Denies redness, rashes, lesions or ulcercations.  Neurological: Denies dizziness, difficulty with memory, difficulty with speech or problems with balance and coordination.  Psych: Denies anxiety, depression, SI/HI.  No other specific complaints in a complete review of systems (except as listed in HPI above).  PE:  BP 108/66   Pulse 78   Temp 98.6 F (37 C) (Oral)   Ht 5\' 2"  (1.575 m)   Wt 118 lb 12 oz (53.9 kg)   LMP 02/28/2017   SpO2 98%   BMI 21.72 kg/m  Wt Readings from Last 3 Encounters:  03/16/17 118 lb 12 oz (53.9 kg)  04/22/16 120 lb (54.4 kg)  12/10/15 118 lb (53.5 kg)    General: Appears her stated age, well developed, well nourished in NAD. Cardiovascular: Normal rate and rhythm. S1,S2 noted.  No murmur, rubs or gallops noted.  Pulmonary/Chest: Normal effort and positive vesicular breath sounds. No respiratory distress. No wheezes, rales or ronchi noted.  Abdomen: Soft and tender in bilateral pelvic regions. Normal bowel sounds, no bruits noted. No distention or masses noted.  Neurological: Alert and oriented.  Psychiatric: Mood and affect normal. Behavior  is normal. Judgment and thought content normal.     BMET    Component Value Date/Time   NA 145 07/31/2014 1128   K 4.2 07/31/2014 1128   CL 106 07/31/2014 1128   CO2 27 07/31/2014 1128   GLUCOSE 92 07/31/2014 1128   BUN 19 07/31/2014 1128   CREATININE 0.58 07/31/2014 1128   CALCIUM 9.4 07/31/2014 1128   GFRNONAA >90 07/31/2014 1128   GFRAA >90 07/31/2014 1128    Lipid Panel  No results found for: CHOL, TRIG, HDL, CHOLHDL, VLDL, LDLCALC  CBC    Component Value Date/Time    WBC 7.7 07/31/2014 1128   RBC 4.98 07/31/2014 1128   HGB 15.0 07/31/2014 1128   HCT 44.8 07/31/2014 1128   PLT 239 07/31/2014 1128   MCV 90.0 07/31/2014 1128   MCH 30.1 07/31/2014 1128   MCHC 33.5 07/31/2014 1128   RDW 13.4 07/31/2014 1128   LYMPHSABS 1.1 02/25/2012 0545   MONOABS 1.2 (H) 02/25/2012 0545   EOSABS 0.1 02/25/2012 0545   BASOSABS 0.0 02/25/2012 0545    Hgb A1C No results found for: HGBA1C   Assessment and Plan:  Pelvic Pain:  Will obtain ultrasound of pelvis/transvaginal today  Will follow up after imaging, RTC as needed Webb Silversmith, NP

## 2017-03-16 NOTE — Patient Instructions (Signed)
Pelvic Pain, Female °Pelvic pain is pain in your lower belly (abdomen), below your belly button and between your hips. The pain may start suddenly (acute), keep coming back (recurring), or last a long time (chronic). Pelvic pain that lasts longer than six months is considered chronic. There are many causes of pelvic pain. Sometimes the cause of your pelvic pain is not known. °Follow these instructions at home: °· Take over-the-counter and prescription medicines only as told by your doctor. °· Rest as told by your doctor. °· Do not have sex it if hurts. °· Keep a journal of your pelvic pain. Write down: °¨ When the pain started. °¨ Where the pain is located. °¨ What seems to make the pain better or worse, such as food or your menstrual cycle. °¨ Any symptoms you have along with the pain. °· Keep all follow-up visits as told by your doctor. This is important. °Contact a doctor if: °· Medicine does not help your pain. °· Your pain comes back. °· You have new symptoms. °· You have unusual vaginal discharge or bleeding. °· You have a fever or chills. °· You are having a hard time pooping (constipation). °· You have blood in your pee (urine) or poop (stool). °· Your pee smells bad. °· You feel weak or lightheaded. °Get help right away if: °· You have sudden pain that is very bad. °· Your pain continues to get worse. °· You have very bad pain and also have any of the following symptoms: °¨ A fever. °¨ Feeling stick to your stomach (nausea). °¨ Throwing up (vomiting). °¨ Being very sweaty. °· You pass out (lose consciousness). °This information is not intended to replace advice given to you by your health care provider. Make sure you discuss any questions you have with your health care provider. °Document Released: 05/26/2008 Document Revised: 01/02/2016 Document Reviewed: 09/28/2015 °Elsevier Interactive Patient Education © 2017 [employer] ° °

## 2017-03-19 ENCOUNTER — Ambulatory Visit
Admission: RE | Admit: 2017-03-19 | Discharge: 2017-03-19 | Disposition: A | Discharge status: Home / Self Care | Payer: 59 | Source: Ambulatory Visit | Attending: Internal Medicine | Admitting: Internal Medicine

## 2017-03-19 DIAGNOSIS — R102 Pelvic and perineal pain: Secondary | ICD-10-CM | POA: Diagnosis not present

## 2017-03-24 ENCOUNTER — Telehealth: Payer: Self-pay | Admitting: Internal Medicine

## 2017-03-24 NOTE — Telephone Encounter (Signed)
Call pt:  Her US shows that she has a bicornate uterus, a uterus with a wall down the middle. Was she aware of this?

## 2017-03-24 NOTE — Telephone Encounter (Signed)
Pt called to get results from 3/29 [place]. She is a pt of [facility] and was told she would get a call within 24 hrs.

## 2017-03-25 NOTE — Telephone Encounter (Signed)
Spoke to pts husband who states he will have her contact office back to discuss results

## 2017-03-25 NOTE — Telephone Encounter (Signed)
Spoke to pt and her husband, at her request as she does not speak [place] well, and informed advised per Edith Endave. Husband states they were made aware of bicornate uterus at the time of her first conception

## 2017-03-25 NOTE — Telephone Encounter (Signed)
Pt returned your call. Best [phone].

## 2017-03-26 ENCOUNTER — Other Ambulatory Visit: Payer: Self-pay | Admitting: Internal Medicine

## 2017-03-26 DIAGNOSIS — N941 Unspecified dyspareunia: Secondary | ICD-10-CM

## 2017-03-26 NOTE — Telephone Encounter (Signed)
Spoke to pt and her husband and advised. They are wanting to proceed with the referral as the pain has not subsided. Advised to await a call with appt details

## 2017-03-26 NOTE — Telephone Encounter (Signed)
The bicornate uterus should not be causing her pain. If pain continues, she should see a GYN. If she needs a referral, please let me know.

## 2017-03-26 NOTE — Telephone Encounter (Signed)
Referral placed.

## 2017-04-06 ENCOUNTER — Ambulatory Visit (INDEPENDENT_AMBULATORY_CARE_PROVIDER_SITE_OTHER): Payer: Self-pay | Admitting: Certified Nurse Midwife

## 2017-04-06 ENCOUNTER — Encounter: Payer: Self-pay | Admitting: Certified Nurse Midwife

## 2017-04-06 VITALS — BP 103/78 | HR 83 | Ht 62.0 in | Wt 117.8 lb

## 2017-04-06 DIAGNOSIS — R102 Pelvic and perineal pain: Secondary | ICD-10-CM

## 2017-04-06 DIAGNOSIS — N941 Unspecified dyspareunia: Secondary | ICD-10-CM

## 2017-04-06 DIAGNOSIS — R1030 Lower abdominal pain, unspecified: Secondary | ICD-10-CM

## 2017-04-06 LAB — POCT URINALYSIS DIPSTICK
BILIRUBIN UA: NEGATIVE
Blood, UA: NEGATIVE
GLUCOSE UA: NEGATIVE
KETONES UA: NEGATIVE
LEUKOCYTES UA: NEGATIVE
Nitrite, UA: NEGATIVE
Protein, UA: NEGATIVE
Spec Grav, UA: 1.01 (ref 1.010–1.025)
Urobilinogen, UA: 0.2 E.U./dL
pH, UA: 6 (ref 5.0–8.0)

## 2017-04-07 DIAGNOSIS — R102 Pelvic and perineal pain: Secondary | ICD-10-CM | POA: Diagnosis not present

## 2017-04-07 NOTE — Progress Notes (Signed)
GYN ENCOUNTER NOTE  Subjective:       Haley Frazier is a 36 y.o. [id number] female here for gynecologic evaluation of constant pelvic pain. She is accompanied by her husband, Gerald Stabs.   She reports pelvic pain, lower abdominal pain and cramping that radiates from one side to the other. The pain is sharp and stabbing.The pain increases with intercourse, deep thrusting, and menses.   Her pain decreases after sex, with resting and the use of heating pad. She reports minimal relief with the use of OTC medications (Motrin and Alka-seltzer).   Her pain first started approximately 10 years ago and was present prior to coitarche.   She reports regular menses occurring monthly and lasting for approximately four (4) days. Her flow is "less than before she had her second child" and managed with tampons/pads.   Crickett reported this pain to her PCP last month and was referred to our office after "no reason" for the pain was found after two (2) ultrasounds. Pt was shown to have a bicornuate uterus which she knew about since her first pregnancy.  Denies difficulty breathing or respiratory distress, chest pain, abdominal pain, unexplained vaginal bleeding, dysuria, and leg pain or swelling.   PCP: Avie Echevaria, NP; last visit 03/16/2017.   Gynecologic History  Patient's last menstrual period was 03/27/2017.  Contraception: vasectomy  Last Pap: 06/2016. Results were: normal  Obstetric History OB History  Gravida Para Term Preterm AB Living  3 2 2   1 2   SAB TAB Ectopic Multiple Live Births  1       2    # Outcome Date GA Lbr Len/2nd Weight Sex Delivery Anes PTL Lv  3 Term 07/10/14 [redacted]w[redacted]d 03:10 / 00:51 7 lb 6 oz (3.345 kg) F Vag-Spont EPI  LIV  2 Term 02/24/12 [redacted]w[redacted]d 04:45 / 03:24 7 lb 6 oz (3.345 kg) M Vag-Vacuum EPI, Local  LIV     Birth Comments: Normal  1 SAB 2012              Past Medical History:  Diagnosis Date  . Dyspareunia in female   . Lipoma of axilla 03/2016   bilateral    Past  Surgical History:  Procedure Laterality Date  . LIPOMA EXCISION Bilateral 04/22/2016   Procedure: EXCISION LIPOMA BILATERAL AXILLA ;  Surgeon: Erroll Luna, MD;  Location: [place];  Service: General;  Laterality: Bilateral;  . NO PAST SURGERIES      Current Outpatient Prescriptions on File Prior to Visit  Medication Sig Dispense Refill  . loratadine (CLARITIN) 10 MG tablet Take 1 tablet (10 mg total) by mouth daily. 30 tablet 11   No current facility-administered medications on file prior to visit.     No Known Allergies  Social History   Social History  . Marital status: Married    Spouse name: N/A  . Number of children: N/A  . Years of education: N/A   Occupational History  . Not on file.   Social History Main Topics  . Smoking status: Never Smoker  . Smokeless tobacco: Never Used  . Alcohol use No  . Drug use: No  . Sexual activity: Yes    Birth control/ protection: None     Comment: husband-vasectomy   Other Topics Concern  . Not on file   Social History Narrative  . No narrative on file    Family History  Problem Relation Age of Onset  . Hypertension Mother   . Cancer Neg Hx   .  Diabetes Neg Hx   . Stroke Neg Hx     The following portions of the patient's history were reviewed and updated as appropriate: allergies, current medications, past family history, past medical history, past social history, past surgical history and problem list.  Review of Systems  Review of Systems - Negative except as noted above.  History obtained from the patient.  Objective:   BP 103/78   Pulse 83   Ht 5\' 2"  (1.575 m)   Wt 117 lb 12.8 oz (53.4 kg)   LMP 03/27/2017   BMI 21.55 kg/m    CONSTITUTIONAL: Well-developed, well-nourished female in no acute distress.   ABDOMEN: Soft, non distended; Non tender.  No Organomegaly.  PELVIC:  External Genitalia: Normal  Vagina: Normal  Cervix: Normal  Uterus: Normal size, shape,consistency,  mobile  Adnexa: Normal  Urine dipstick shows negative for all components.  Assessment:   1. Pelvic pain  - POCT urinalysis dipstick - NuSwab Vaginitis Plus (VG+)  2. Lower abdominal pain  3. Dyspareunia in female  Plan:   NuSwab collected, will contact pt with results.   Reviewed options for management of symptoms including OCPs, IUD insertion, and NSAIDs.   Offered physician referral for further diagnostic procedures if needed. Pt would like to follow up with Dr. Marcelline Mates and discuss her options. Advised to scheduled appointment.   Reviewed red flag symptoms and when to call.   RTC as needed.    Diona Fanti, CNM

## 2017-04-09 ENCOUNTER — Other Ambulatory Visit: Payer: Self-pay | Admitting: Physician Assistant

## 2017-04-09 DIAGNOSIS — J329 Chronic sinusitis, unspecified: Secondary | ICD-10-CM

## 2017-04-10 ENCOUNTER — Encounter: Payer: Self-pay | Admitting: Obstetrics and Gynecology

## 2017-04-13 LAB — NUSWAB VAGINITIS PLUS (VG+)
CANDIDA GLABRATA, NAA: POSITIVE — AB
CHLAMYDIA TRACHOMATIS, NAA: NEGATIVE
Candida albicans, NAA: NEGATIVE
NEISSERIA GONORRHOEAE, NAA: NEGATIVE
TRICH VAG BY NAA: NEGATIVE

## 2017-04-14 ENCOUNTER — Ambulatory Visit (INDEPENDENT_AMBULATORY_CARE_PROVIDER_SITE_OTHER): Payer: 59 | Admitting: Obstetrics and Gynecology

## 2017-04-14 ENCOUNTER — Encounter: Payer: Self-pay | Admitting: Obstetrics and Gynecology

## 2017-04-14 VITALS — BP 111/73 | HR 79 | Ht 62.0 in | Wt 119.1 lb

## 2017-04-14 DIAGNOSIS — B3731 Acute candidiasis of vulva and vagina: Secondary | ICD-10-CM

## 2017-04-14 DIAGNOSIS — Q513 Bicornate uterus: Secondary | ICD-10-CM | POA: Diagnosis not present

## 2017-04-14 DIAGNOSIS — R102 Pelvic and perineal pain: Secondary | ICD-10-CM | POA: Diagnosis not present

## 2017-04-14 DIAGNOSIS — N941 Unspecified dyspareunia: Secondary | ICD-10-CM | POA: Diagnosis not present

## 2017-04-14 DIAGNOSIS — R1030 Lower abdominal pain, unspecified: Secondary | ICD-10-CM | POA: Insufficient documentation

## 2017-04-14 DIAGNOSIS — B373 Candidiasis of vulva and vagina: Secondary | ICD-10-CM | POA: Diagnosis not present

## 2017-04-14 MED ORDER — FLUCONAZOLE 150 MG PO TABS: 150.0000 mg | ORAL_TABLET | Freq: Once | ORAL | 3 refills | Status: AC

## 2017-04-14 NOTE — Progress Notes (Signed)
GYNECOLOGY PROGRESS NOTE  Subjective:    Patient ID: Haley Frazier, female    DOB: 1981/10/01, 36 y.o.   MRN: [id number]  HPI  Patient is a 36 y.o. G58P2012 female who presents for discussion of test results (Nuswab) and further discussion of symptoms (referred from Eloisa Northern, CNM for suspected endometriosis).   She reports pelvic pain, lower abdominal pain and cramping that radiates from one side to the other. The pain is sharp and stabbing.The pain increases with intercourse, deep thrusting, and menses. IT decreases after sex, with resting and the use of heating pad. She reports minimal relief with the use of OTC medications (Motrin and Alka-seltzer). Notes that her periods are often painful, however denies irregular, heavy, or prolonged menses.    The following portions of the patient's history were reviewed and updated as appropriate: allergies, current medications, past family history, past medical history, past social history, past surgical history and problem list.  Review of Systems Pertinent items noted in HPI and remainder of comprehensive ROS otherwise negative.   Objective:   Blood pressure 111/73, pulse 79, height 5\' 2"  (1.575 m), weight 119 lb 1.6 oz (54 kg), last menstrual period 03/27/2017. General appearance: alert and no distress Abdomen: soft, non-tender; bowel sounds normal; no masses,  no organomegaly Pelvic: external genitalia normal, rectovaginal septum normal.  Vagina without discharge.  Cervix normal appearing, no lesions and no motion tenderness.  Uterus mobile, nontender, normal shape and size.  Adnexae non-palpable, nontender bilaterally.  Extremities: extremities normal, atraumatic, no cyanosis or edema Neurologic: Grossly normal   Imaging (Pelvic Ultrasound) 03/19/2017:  CLINICAL DATA:  Chronic pelvic pain  EXAM: TRANSABDOMINAL AND TRANSVAGINAL ULTRASOUND OF PELVIS  TECHNIQUE: Both transabdominal and transvaginal ultrasound examinations of  the pelvis were performed. Transabdominal technique was performed for global imaging of the pelvis including uterus, ovaries, adnexal regions, and pelvic cul-de-sac. It was necessary to proceed with endovaginal exam following the transabdominal exam to visualize the uterus.  COMPARISON:  None  FINDINGS: Uterus  Measurements: 7.9 x 3.6 x 5.2 cm. No fibroids or other mass visualized. Possible bicornuate or septate anatomy, but not definite.  Endometrium  Thickness: 10-11 is mm.  No focal abnormality visualized.  Right ovary  Measurements: 2.8 x 2.5 x 2.3 cm. Normal appearance/no adnexal mass.  Left ovary  Measurements: 2.6 x 1.9 x 2.4 cm. Normal appearance/no adnexal mass.  Other findings  No abnormal free fluid.  IMPRESSION: Possible bicornuate or septate uterine anatomy. Otherwise unremarkable.    Labs:  Results for orders placed or performed in visit on 04/06/17  NuSwab Vaginitis Plus (VG+)  Result Value Ref Range   Atopobium vaginae Low - 0 Score   BVAB 2 Low - 0 Score   Megasphaera 1 Low - 0 Score   Candida albicans, NAA Negative Negative   Candida glabrata, NAA Positive (A) Negative   Trich vag by NAA Negative Negative   Chlamydia trachomatis, NAA Negative Negative   Neisseria gonorrhoeae, NAA Negative Negative  POCT urinalysis dipstick  Result Value Ref Range   Color, UA pale yellow    Clarity, UA clear    Glucose, UA neg    Bilirubin, UA neg    Ketones, UA neg    Spec Grav, UA 1.010 1.010 - 1.025   Blood, UA neg    pH, UA 6.0 5.0 - 8.0   Protein, UA neg    Urobilinogen, UA 0.2 0.2 or 1.0 E.U./dL   Nitrite, UA neg    Leukocytes, UA  Negative Negative    Assessment:   Pelvic pain Dyspareunia Bicornuate uterus Yeast candidiasis  Plan:   - Discussed with patient that based on symptoms she may likely have a diagnosis of endometriosis. Discussed etiology, symptoms, and diagnosis (gold standard is laparoscopy, but often times  symptomatic treatment is performed prior to proceeding to surgery).  Discussed management options with hormonal therapy (contraceptive options including OCPs, NuvaRing, Depo Provera, Mirena IUD), as well as Danazole.  Discussed that in refractory cases Lupron could be used. In severe cases hysterectomy can be considered.  Patient given educational handouts on endometriosis, as well as hormonal management options.   Notes that she may consider IUD placement.  To make appointment if IUD placement is desired, or if future discussion of options is desired.  - Bicornuate uterus, not likely the cause of her symptoms, is an incidental finding.  Usually does not cause problems except for can potentially lead to issues with fertility (although patient has 2 children).  - Prescribed Diflucan for yeast vaginitis.    A total of 25 minutes were spent face-to-face with the patient during this encounter and over half of that time involved counseling and coordination of care.   Rubie Maid, MD [facility]

## 2017-04-17 ENCOUNTER — Encounter: Payer: Self-pay | Admitting: Obstetrics and Gynecology

## 2017-04-17 DIAGNOSIS — Q513 Bicornate uterus: Secondary | ICD-10-CM

## 2017-04-17 DIAGNOSIS — N941 Unspecified dyspareunia: Secondary | ICD-10-CM | POA: Insufficient documentation

## 2017-04-17 HISTORY — DX: Bicornate uterus: Q51.3

## 2017-05-04 ENCOUNTER — Encounter: Payer: 59 | Admitting: Obstetrics and Gynecology

## 2017-05-04 ENCOUNTER — Telehealth: Payer: Self-pay | Admitting: General Surgery

## 2017-05-04 ENCOUNTER — Encounter: Payer: Self-pay | Admitting: Certified Nurse Midwife

## 2017-05-04 ENCOUNTER — Ambulatory Visit (INDEPENDENT_AMBULATORY_CARE_PROVIDER_SITE_OTHER): Payer: 59 | Admitting: Certified Nurse Midwife

## 2017-05-04 VITALS — BP 116/80 | HR 73 | Ht 62.0 in | Wt 117.3 lb

## 2017-05-04 DIAGNOSIS — Z01419 Encounter for gynecological examination (general) (routine) without abnormal findings: Secondary | ICD-10-CM | POA: Diagnosis not present

## 2017-05-04 DIAGNOSIS — N644 Mastodynia: Secondary | ICD-10-CM | POA: Diagnosis not present

## 2017-05-04 DIAGNOSIS — D1720 Benign lipomatous neoplasm of skin and subcutaneous tissue of unspecified limb: Secondary | ICD-10-CM | POA: Diagnosis not present

## 2017-05-04 NOTE — Telephone Encounter (Signed)
LEFT MESSAGE WITH CHRIS ARREDONDO (SPOUSE)FOR PATIENT TO CALL BACK & SCHEDULE AN APPOINTMENT WITH DR Mariann Barter MD) FOR LIPOMA OF AXILLA.REF'D BY JENKINS MICHELLE LOWHORN CNM WITH [facility].

## 2017-05-04 NOTE — Patient Instructions (Signed)
Breast Tenderness Breast tenderness is a common problem for women of all ages. Breast tenderness may cause mild discomfort to severe pain. The pain usually comes and goes in association with your menstrual cycle, but it can be constant. Breast tenderness has many possible causes, including hormone changes and some medicines. Your health care provider may order tests, such as a mammogram or an ultrasound, to check for any unusual findings. Having breast tenderness usually does not mean that you have breast cancer. Follow these instructions at home: Sometimes, reassurance that you do not have breast cancer is all that is needed. In general, follow these home care instructions: Managing pain and discomfort   If directed, apply ice to the area:  Put ice in a plastic bag.  Place a towel between your skin and the bag.  Leave the ice on for 20 minutes, 2-3 times a day.  Make sure you are wearing a supportive bra, especially during exercise. You may also want to wear a supportive bra while sleeping if your breasts are very tender. Medicines   Take over-the-counter and prescription medicines only as told by your health care provider. If the cause of your pain is infection, you may be prescribed an antibiotic medicine.  If you were prescribed an antibiotic, take it as told by your health care provider. Do not stop taking the antibiotic even if you start to feel better. General instructions   Your health care provider may recommend that you reduce the amount of fat in your diet. You can do this by:  Limiting fried foods.  Cooking foods using methods, such as baking, boiling, grilling, and broiling.  Decrease the amount of caffeine in your diet. You can do this by drinking more water and choosing caffeine-free options.  Keep a log of the days and times when your breasts are most tender.  Ask your health care provider how to do breast exams at home. This will help you notice if you have an unusual  growth or lump. Contact a health care provider if:  Any part of your breast is hard, red, and hot to the touch. This may be a sign of infection.  You are not breastfeeding and you have fluid, especially blood or pus, coming out of your nipples.  You have a fever.  You have a new or painful lump in your breast that remains after your menstrual period ends.  Your pain does not improve or it gets worse.  Your pain is interfering with your daily activities. This information is not intended to replace advice given to you by your health care provider. Make sure you discuss any questions you have with your health care provider. Document Released: 11/20/2008 Document Revised: 09/05/2016 Document Reviewed: 09/05/2016 Elsevier Interactive Patient Education  2017 [place] 18-39 Years, Female Preventive care refers to lifestyle choices and visits with your health care provider that can promote health and wellness. What does preventive care include?  A yearly physical exam. This is also called an annual well check.  Dental exams once or twice a year.  Routine eye exams. Ask your health care provider how often you should have your eyes checked.  Personal lifestyle choices, including:  Daily care of your teeth and gums.  Regular physical activity.  Eating a healthy diet.  Avoiding tobacco and drug use.  Limiting alcohol use.  Practicing safe sex.  Taking vitamin and mineral supplements as recommended by your health care provider. What happens during an annual well check?  The services and screenings done by your health care provider during your annual well check will depend on your age, overall health, lifestyle risk factors, and family history of disease. Counseling  Your health care provider may ask you questions about your:  Alcohol use.  Tobacco use.  Drug use.  Emotional well-being.  Home and relationship well-being.  Sexual activity.  Eating  habits.  Work and work Statistician.  Method of birth control.  Menstrual cycle.  Pregnancy history. Screening  You may have the following tests or measurements:  Height, weight, and BMI.  Diabetes screening. This is done by checking your blood sugar (glucose) after you have not eaten for a while (fasting).  Blood pressure.  Lipid and cholesterol levels. These may be checked every 5 years starting at age 34.  Skin check.  Hepatitis C blood test.  Hepatitis B blood test.  Sexually transmitted disease (STD) testing.  BRCA-related cancer screening. This may be done if you have a family history of breast, ovarian, tubal, or peritoneal cancers.  Pelvic exam and Pap test. This may be done every 3 years starting at age 74. Starting at age 22, this may be done every 5 years if you have a Pap test in combination with an HPV test. Discuss your test results, treatment options, and if necessary, the need for more tests with your health care provider. Vaccines  Your health care provider may recommend certain vaccines, such as:  Influenza vaccine. This is recommended every year.  Tetanus, diphtheria, and acellular pertussis (Tdap, Td) vaccine. You may need a Td booster every 10 years.  Varicella vaccine. You may need this if you have not been vaccinated.  HPV vaccine. If you are 36 or younger, you may need three doses over 6 months.  Measles, mumps, and rubella (MMR) vaccine. You may need at least one dose of MMR. You may also need a second dose.  Pneumococcal 13-valent conjugate (PCV13) vaccine. You may need this if you have certain conditions and were not previously vaccinated.  Pneumococcal polysaccharide (PPSV23) vaccine. You may need one or two doses if you smoke cigarettes or if you have certain conditions.  Meningococcal vaccine. One dose is recommended if you are age 83-21 years and a first-year college student living in a residence hall, or if you have one of several  medical conditions. You may also need additional booster doses.  Hepatitis A vaccine. You may need this if you have certain conditions or if you travel or work in places where you may be exposed to hepatitis A.  Hepatitis B vaccine. You may need this if you have certain conditions or if you travel or work in places where you may be exposed to hepatitis B.  Haemophilus influenzae type b (Hib) vaccine. You may need this if you have certain risk factors. Talk to your health care provider about which screenings and vaccines you need and how often you need them. This information is not intended to replace advice given to you by your health care provider. Make sure you discuss any questions you have with your health care provider. Document Released: 02/03/2002 Document Revised: 08/27/2016 Document Reviewed: 10/09/2015 Elsevier Interactive Patient Education  2017 [employer].

## 2017-05-04 NOTE — Progress Notes (Signed)
ANNUAL PREVENTATIVE CARE GYN  ENCOUNTER NOTE  Subjective:       Haley Frazier is a 36 y.o. G4P2012 female here for a routine annual gynecologic exam.    She reports cyclic breast pain and abdominal cramping. Pt was seen by Dr. Marcelline Mates on 04/14/2017 to discuss treatment for endometriosis. Pt and spouse are still deciding on treatment options.   Pt had surgical removal of bilateral axillary lipoma by Erroll Luna, MD on 04/22/2016. She reports that her scar tissue on the left side is larger than her scar tissue on the right which prevents her from wearing sleeveless tops and dresses.   Denies difficulty breathing or respiratory distress, chest pain, abdominal pain, unexplained vaginal bleeding, dysuria, and leg pain or swelling.    PCP: Webb Silversmith, NP; last visit 03/16/2017.   Menstrual History  Period Cycle (Days): 28 Period Duration (Days): 4 Period Pattern: Regular Menstrual Flow: Light Menstrual Control: Maxi pad Dysmenorrhea: (!) Moderate Dysmenorrhea Symptoms: Cramping  Gynecologic History  Patient's last menstrual period was 04/26/2017 (exact date).  Contraception: vasectomy.  Last Pap: 06/2016. Results were: normal; unsure if HPV co-testing.   Obstetric History  OB History  Gravida Para Term Preterm AB Living  3 2 2   1 2   SAB TAB Ectopic Multiple Live Births  1       2    # Outcome Date GA Lbr Len/2nd Weight Sex Delivery Anes PTL Lv  3 Term 07/10/14 [redacted]w[redacted]d 03:10 / 00:51 7 lb 6 oz (3.345 kg) F Vag-Spont EPI  LIV  2 Term 02/24/12 [redacted]w[redacted]d 04:45 / 03:24 7 lb 6 oz (3.345 kg) M Vag-Vacuum EPI, Local  LIV     Birth Comments: Normal  1 SAB 2012              Past Medical History:  Diagnosis Date  . Bicornuate uterus 04/17/2017  . Dyspareunia in female   . Lipoma of axilla 03/2016   bilateral    Past Surgical History:  Procedure Laterality Date  . LIPOMA EXCISION Bilateral 04/22/2016   Procedure: EXCISION LIPOMA BILATERAL AXILLA ;  Surgeon: Erroll Luna, MD;   Location: [place];  Service: General;  Laterality: Bilateral;  . NO PAST SURGERIES      Current Outpatient Prescriptions on File Prior to Visit  Medication Sig Dispense Refill  . loratadine (CLARITIN) 10 MG tablet Take 1 tablet (10 mg total) by mouth daily. 30 tablet 11   No current facility-administered medications on file prior to visit.     No Known Allergies  Social History   Social History  . Marital status: Married    Spouse name: N/A  . Number of children: N/A  . Years of education: N/A   Occupational History  . Not on file.   Social History Main Topics  . Smoking status: Never Smoker  . Smokeless tobacco: Never Used  . Alcohol use No  . Drug use: No  . Sexual activity: Yes    Birth control/ protection: Other-see comments     Comment: husband-vasectomy   Other Topics Concern  . Not on file   Social History Narrative  . No narrative on file    Family History  Problem Relation Age of Onset  . Hypertension Mother   . Cancer Neg Hx   . Diabetes Neg Hx   . Stroke Neg Hx     The following portions of the patient's history were reviewed and updated as appropriate: allergies, current medications, past family history,  past medical history, past social history, past surgical history and problem list.  Review of Systems  ROS negative except as noted above. Information obtained from patient and spouse.    Objective:   BP 116/80   Pulse 73   Ht 5\' 2"  (1.575 m)   Wt 117 lb 4.8 oz (53.2 kg)   LMP 04/26/2017 (Exact Date)   BMI 21.45 kg/m  CONSTITUTIONAL: Well-developed, well-nourished female in no acute distress.   PSYCHIATRIC: Normal mood and affect. Normal behavior. Normal judgment and thought content.  [place]: Alert and oriented to person, place, and time. Normal muscle tone coordination. No cranial nerve deficit noted.  HENT:  Normocephalic, atraumatic, External right and left ear normal. Oropharynx is clear and moist  EYES:  Conjunctivae and EOM are normal. Pupils are equal, round, and reactive to light. No scleral icterus.   NECK: Normal range of motion, supple, no masses.  Normal thyroid.   SKIN: Skin is warm and dry. No rash noted. Not diaphoretic. No erythema. No pallor.  CARDIOVASCULAR: Normal heart rate noted, regular rhythm, no murmur.  RESPIRATORY: Clear to auscultation bilaterally. Effort and breath sounds normal, no problems with respiration noted.  BREASTS: Symmetric in size. No masses, skin changes, nipple drainage, or lymphadenopathy. ABDOMEN: Soft, normal bowel sounds, no distention noted.  No tenderness, rebound or guarding.   BLADDER: Normal  PELVIC:  External Genitalia: Normal  Vagina: Normal  Cervix: Normal, PAP collected  Uterus: Normal  Adnexa: Normal   MUSCULOSKELETAL: Normal range of motion. No tenderness.  No cyanosis, clubbing, or edema.  2+ distal pulses.  LYMPHATIC: No Axillary, Supraclavicular, or Inguinal Adenopathy.  Left axilla scar tissue > right axilla scar tissue  Assessment:   Annual gynecologic examination 36 y.o.   Contraception: vasectomy   Normal BMI   Problem List Items Addressed This Visit    None    Visit Diagnoses    Well woman exam with routine gynecological exam    -  Primary   Relevant Orders   Pap IG and HPV (high risk) DNA detection   CBC   Comprehensive metabolic panel   Lipid panel   Breast pain       Lipoma of axilla       Relevant Orders   Ambulatory referral to General Surgery      Plan:   Pap: Pap Co Test   Mammogram: Not Indicated   Stool Guaiac Testing:  Not Indicated   Labs: CBC, CMP,  and Lipid 1   Routine preventative health maintenance measures emphasized: Exercise/Diet/Weight control and Stress Management   Discussed treatment for cyclic breast pain including decreased caffeine and sodium intake, increased water intake, and supplementation with vitamin E or evening primrose oil.   Referral placed for surgeon due  to history of Lipoma removal, re possible scar revision.  Return to [place], CNM

## 2017-05-06 LAB — PAP IG AND HPV HIGH-RISK
HPV, HIGH-RISK: NEGATIVE
PAP Smear Comment: 0

## 2017-05-11 ENCOUNTER — Other Ambulatory Visit: Payer: 59

## 2017-05-11 DIAGNOSIS — Z01419 Encounter for gynecological examination (general) (routine) without abnormal findings: Secondary | ICD-10-CM

## 2017-05-12 LAB — COMPREHENSIVE METABOLIC PANEL
ALT: 13 IU/L (ref 0–32)
AST: 16 IU/L (ref 0–40)
Albumin/Globulin Ratio: 1.5 (ref 1.2–2.2)
Albumin: 4.2 g/dL (ref 3.5–5.5)
Alkaline Phosphatase: 45 IU/L (ref 39–117)
BUN/Creatinine Ratio: 29 — ABNORMAL HIGH (ref 9–23)
BUN: 17 mg/dL (ref 6–20)
Bilirubin Total: 0.4 mg/dL (ref 0.0–1.2)
CO2: 25 mmol/L (ref 18–29)
Calcium: 8.5 mg/dL — ABNORMAL LOW (ref 8.7–10.2)
Chloride: 101 mmol/L (ref 96–106)
Creatinine, Ser: 0.59 mg/dL (ref 0.57–1.00)
GFR calc Af Amer: 138 mL/min/{1.73_m2} (ref >59)
GFR calc non Af Amer: 120 mL/min/{1.73_m2} (ref >59)
Globulin, Total: 2.8 g/dL (ref 1.5–4.5)
Glucose: 97 mg/dL (ref 65–99)
Potassium: 4.1 mmol/L (ref 3.5–5.2)
Sodium: 139 mmol/L (ref 134–144)
Total Protein: 7 g/dL (ref 6.0–8.5)

## 2017-05-12 LAB — CBC
Hematocrit: 42 % (ref 34.0–46.6)
Hemoglobin: 13.4 g/dL (ref 11.1–15.9)
MCH: 29 pg (ref 26.6–33.0)
MCHC: 31.9 g/dL (ref 31.5–35.7)
MCV: 91 fL (ref 79–97)
Platelets: 241 10*3/uL (ref 150–379)
RBC: 4.62 x10E6/uL (ref 3.77–5.28)
RDW: 13 % (ref 12.3–15.4)
WBC: 7.3 10*3/uL (ref 3.4–10.8)

## 2017-05-12 LAB — LIPID PANEL
CHOL/HDL RATIO: 2.8 ratio (ref 0.0–4.4)
Cholesterol, Total: 140 mg/dL (ref 100–199)
HDL: 50 mg/dL (ref >39)
LDL Calculated: 74 mg/dL (ref 0–99)
TRIGLYCERIDES: 79 mg/dL (ref 0–149)
VLDL Cholesterol Cal: 16 mg/dL (ref 5–40)

## 2017-05-29 ENCOUNTER — Encounter: Payer: Self-pay | Admitting: General Surgery

## 2017-09-18 ENCOUNTER — Encounter: Payer: Self-pay | Admitting: *Deleted

## 2017-09-18 ENCOUNTER — Ambulatory Visit
Admission: EM | Admit: 2017-09-18 | Discharge: 2017-09-18 | Disposition: A | Discharge status: Home / Self Care | Payer: 59 | Attending: Family Medicine | Admitting: Family Medicine

## 2017-09-18 DIAGNOSIS — J069 Acute upper respiratory infection, unspecified: Secondary | ICD-10-CM

## 2017-09-18 HISTORY — DX: Benign lipomatous neoplasm, unspecified: D17.9

## 2017-09-18 MED ORDER — HYDROCOD POLST-CPM POLST ER 10-8 MG/5ML PO SUER: 5.0000 mL | Freq: Two times a day (BID) | ORAL | 0 refills | Status: DC

## 2017-09-18 MED ORDER — BENZONATATE 200 MG PO CAPS: ORAL_CAPSULE | ORAL | 0 refills | Status: DC

## 2017-09-18 MED ORDER — AZITHROMYCIN 250 MG PO TABS: ORAL_TABLET | ORAL | 0 refills | Status: DC

## 2017-09-18 NOTE — ED Provider Notes (Signed)
[facility]    CSN: [id number] Arrival date & time: 09/18/17  1716     History   Chief Complaint Chief Complaint  Patient presents with  . Cough  . Headache    HPI Haley Frazier is a 36 y.o. female.   HPI  This a 36 year old female who is accompanied by her husband. She has had upper respiratory symptoms for a week with a severe cough that developed 4 days ago. He states she caught this from her son who was sick previously. She denies any fever but has had chills. She does not have any nasal symptoms. The cough is keeping her up at nighttime. It is productive of yellow sputum. She is a nonsmoker.            Past Medical History:  Diagnosis Date  . Bicornuate uterus 04/17/2017  . Dyspareunia in female   . Lipoma   . Lipoma of axilla 03/2016   bilateral    Patient Active Problem List   Diagnosis Date Noted  . Bicornuate uterus 04/17/2017  . Dyspareunia in female 04/17/2017  . Pelvic pain 04/14/2017  . Lower abdominal pain 04/14/2017    Past Surgical History:  Procedure Laterality Date  . LIPOMA EXCISION Bilateral 04/22/2016   Procedure: EXCISION LIPOMA BILATERAL AXILLA ;  Surgeon: Erroll Luna, MD;  Location: [facility];  Service: General;  Laterality: Bilateral;  . NO PAST SURGERIES      OB History    Gravida Para Term Preterm AB Living   3 2 2   1 2    SAB TAB Ectopic Multiple Live Births   1       2       Home Medications    Prior to Admission medications   Medication Sig Start Date End Date Taking? Authorizing Provider  azithromycin (ZITHROMAX Z-PAK) 250 MG tablet Use as per package instructions 09/18/17   Lorin Picket, PA-C  benzonatate (TESSALON) 200 MG capsule Take one cap TID PRN cough 09/18/17   Lorin Picket, PA-C  chlorpheniramine-HYDROcodone (TUSSIONEX PENNKINETIC ER) 10-8 MG/5ML SUER Take 5 mLs by mouth 2 (two) times daily. 09/18/17   Lorin Picket, PA-C  loratadine (CLARITIN) 10 MG tablet Take 1  tablet (10 mg total) by mouth daily. 12/10/15   Joretta Bachelor, PA    Family History Family History  Problem Relation Age of Onset  . Hypertension Mother   . Cancer Neg Hx   . Diabetes Neg Hx   . Stroke Neg Hx     Social History Social History  Substance Use Topics  . Smoking status: Never Smoker  . Smokeless tobacco: Never Used  . Alcohol use No     Allergies   Patient has no known allergies.   Review of Systems Review of Systems  Constitutional: Positive for activity change, chills and fatigue. Negative for appetite change, diaphoresis and fever.  Respiratory: Positive for cough. Negative for shortness of breath, wheezing and stridor.   All other systems reviewed and are negative.    Physical Exam Triage Vital Signs ED Triage Vitals  Enc Vitals Group     BP 09/18/17 1759 117/80     Pulse Rate 09/18/17 1759 86     Resp 09/18/17 1759 12     Temp 09/18/17 1759 98.4 F (36.9 C)     Temp Source 09/18/17 1759 Oral     SpO2 09/18/17 1759 99 %     Weight 09/18/17 1756 120 lb (54.4  kg)     Height 09/18/17 1756 5\' 2"  (1.575 m)     Head Circumference --      Peak Flow --      Pain Score 09/18/17 1756 0     Pain Loc --      Pain Edu? --      Excl. in [place]? --    No data found.   Updated Vital Signs BP 117/80 (BP Location: Left Arm)   Pulse 86   Temp 98.4 F (36.9 C) (Oral)   Resp 12   Ht 5\' 2"  (1.575 m)   Wt 120 lb (54.4 kg)   LMP 09/10/2017   SpO2 99%   BMI 21.95 kg/m   Visual Acuity Right Eye Distance:   Left Eye Distance:   Bilateral Distance:    Right Eye Near:   Left Eye Near:    Bilateral Near:     Physical Exam  Constitutional: She is oriented to person, place, and time. She appears well-developed and well-nourished. No distress.  HENT:  Head: Normocephalic.  Right Ear: External ear normal.  Left Ear: External ear normal.  Nose: Nose normal.  Mouth/Throat: Oropharynx is clear and moist. No oropharyngeal exudate.  Eyes: Pupils are  equal, round, and reactive to light. Right eye exhibits no discharge. Left eye exhibits no discharge.  Neck: Normal range of motion.  Pulmonary/Chest: Effort normal and breath sounds normal. No stridor.  Patient cough frequently throughout the entire examination  Musculoskeletal: Normal range of motion.  Lymphadenopathy:    She has no cervical adenopathy.  Neurological: She is alert and oriented to person, place, and time.  Skin: Skin is warm and dry. She is not diaphoretic.  Psychiatric: She has a normal mood and affect. Her behavior is normal. Judgment and thought content normal.  Nursing note and vitals reviewed.    UC Treatments / Results  Labs (all labs ordered are listed, but only abnormal results are displayed) Labs Reviewed - No data to display  EKG  EKG Interpretation None       Radiology No results found.  Procedures Procedures (including critical care time)  Medications Ordered in UC Medications - No data to display   Initial Impression / Assessment and Plan / UC Course  I have reviewed the triage vital signs and the nursing notes.  Pertinent labs & imaging results that were available during my care of the patient were reviewed by me and considered in my medical decision making (see chart for details).     Plan: 1. Test/x-ray results and diagnosis reviewed with patient 2. rx as per orders; risks, benefits, potential side effects reviewed with patient 3. Recommend supportive treatment with use of grumous vaporizer at nighttime. Use Tessalon Perles in the daytime and Tussionex at nighttime. If you not improving or worsening recommend following up with your primary care physician. 4. F/u prn if symptoms worsen or don't improve   Final Clinical Impressions(s) / UC Diagnoses   Final diagnoses:  Upper respiratory tract infection, unspecified type    New Prescriptions Discharge Medication List as of 09/18/2017  7:44 PM    START taking these medications    Details  azithromycin (ZITHROMAX Z-PAK) 250 MG tablet Use as per package instructions, Print    benzonatate (TESSALON) 200 MG capsule Take one cap TID PRN cough, Print    chlorpheniramine-HYDROcodone (TUSSIONEX PENNKINETIC ER) 10-8 MG/5ML SUER Take 5 mLs by mouth 2 (two) times daily., Starting Fri 09/18/2017, Print  Controlled Substance Prescriptions [place] Controlled Substance Registry consulted? Not Applicable   Lorin Picket, PA-C 09/18/17 1949

## 2017-09-18 NOTE — ED Triage Notes (Signed)
Pt c/o headache and having productive cough

## 2017-11-04 IMAGING — US US EXTREM  UP VENOUS*R*
1 series · 13 of 24 positions shown · non-contrast
Comparison: None.

CLINICAL DATA: Pain in the mid and distal right upper arm. History
of right axillary surgery in April 2016.

EXAM:
RIGHT UPPER EXTREMITY VENOUS DOPPLER ULTRASOUND
TECHNIQUE: Gray-scale sonography with graded compression, as well as color
Doppler and duplex ultrasound were performed to evaluate the upper
extremity deep venous system from the level of the subclavian vein
and including the jugular, axillary, basilic and upper cephalic
vein. Spectral Doppler was utilized to evaluate flow at rest and
with distal augmentation maneuvers.

[Series 1: us extrem up venous*right* · 13 of 29 slices shown]
[im 1/29]
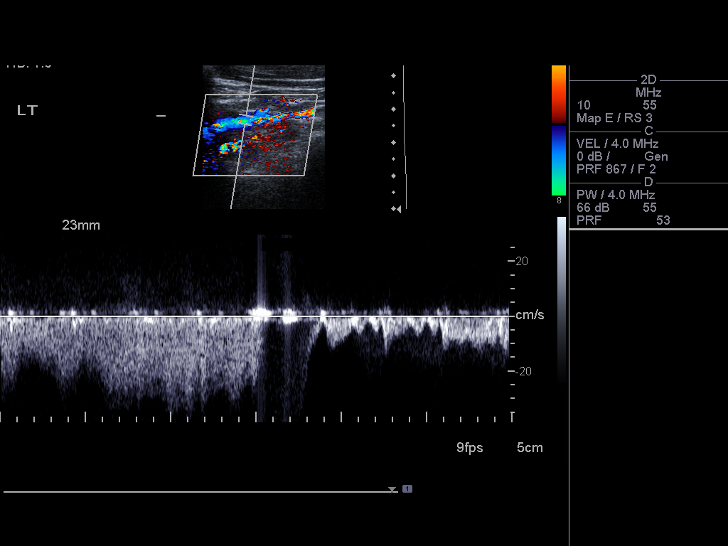
[im 3/29]
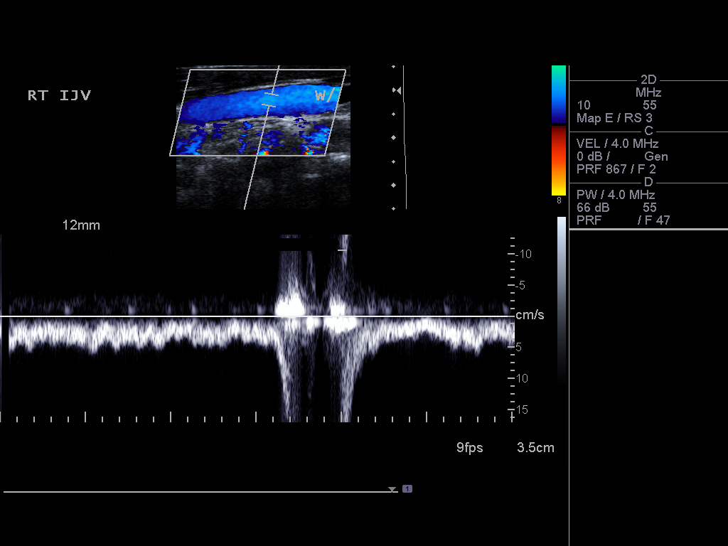
[im 5/29]
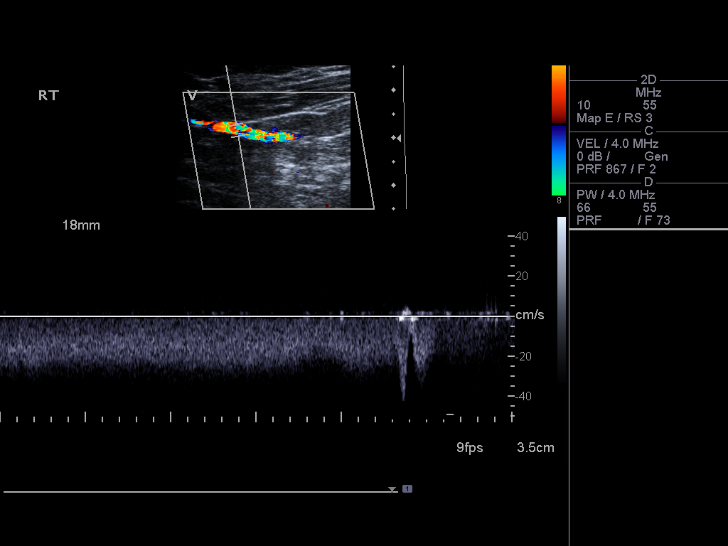
[im 8/29]
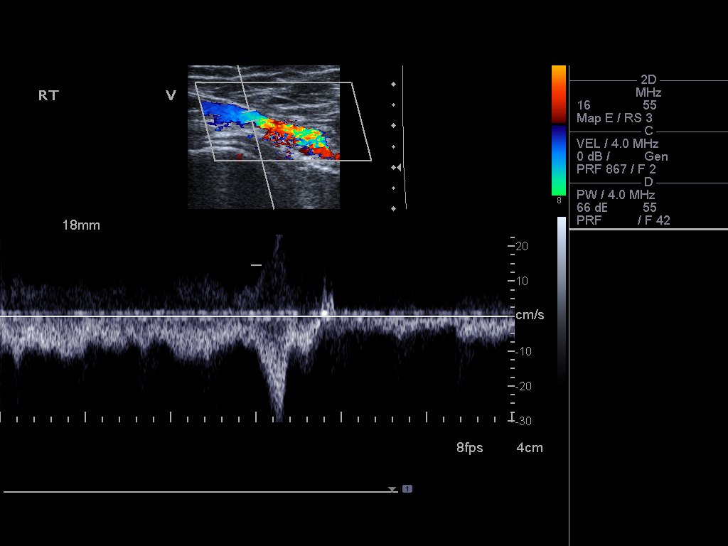
[im 10/29]
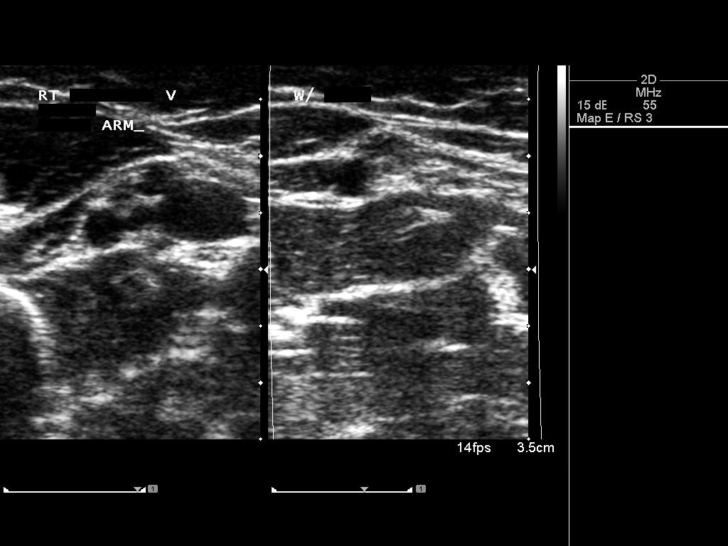
[im 13/29]
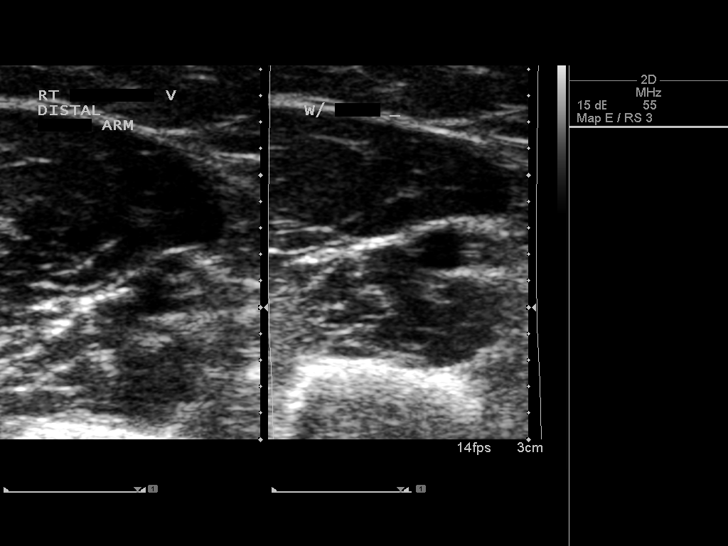
[im 15/29]
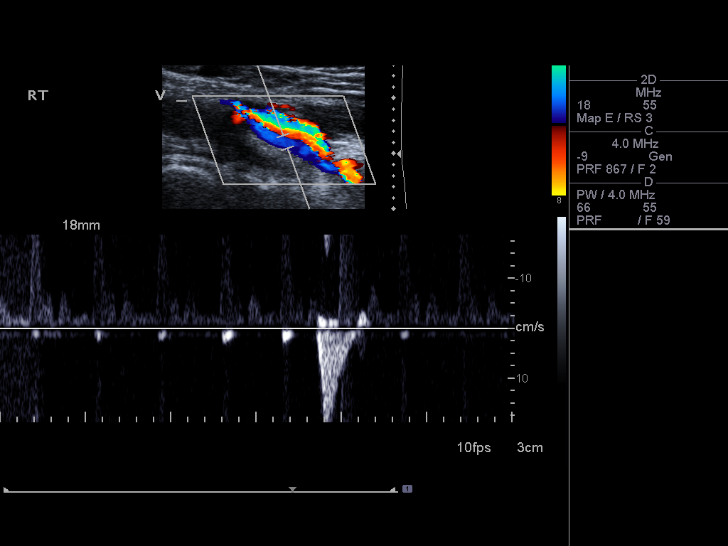
[im 16/29]
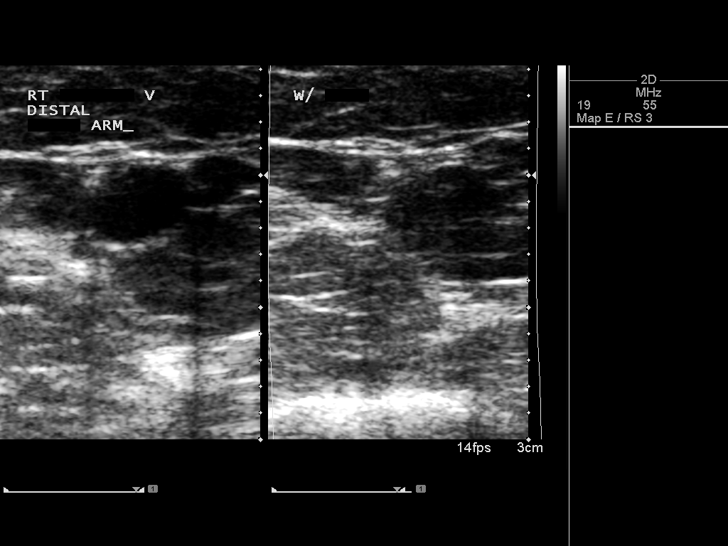
[im 19/29]
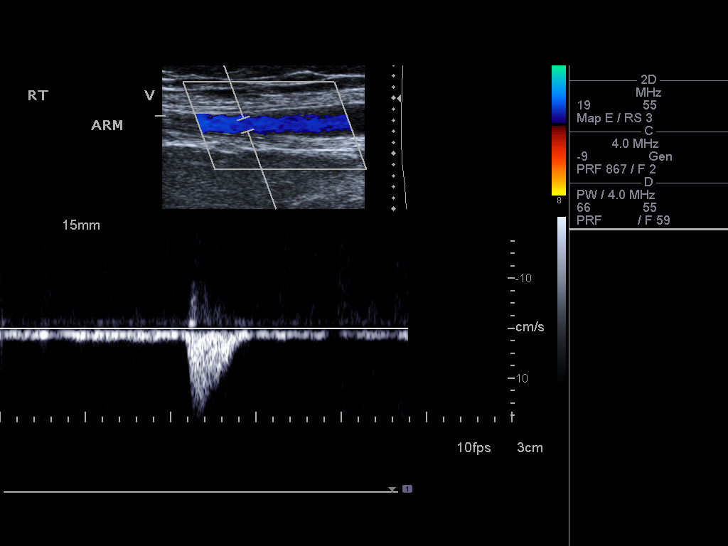
[im 21/29]
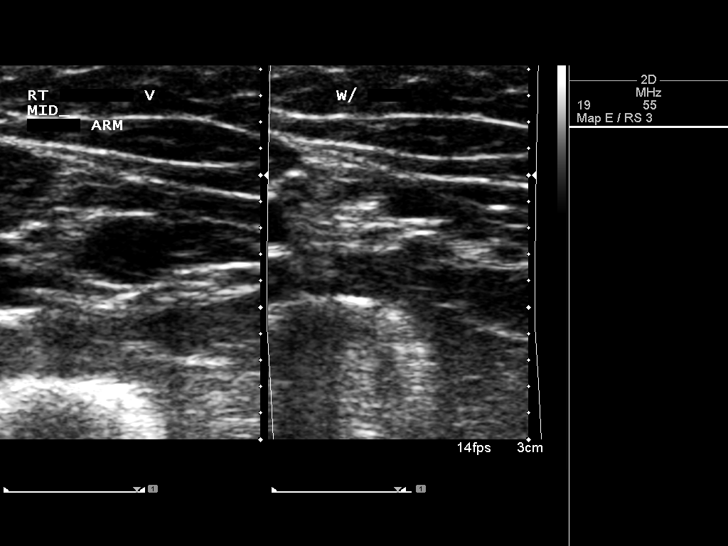
[im 24/29]
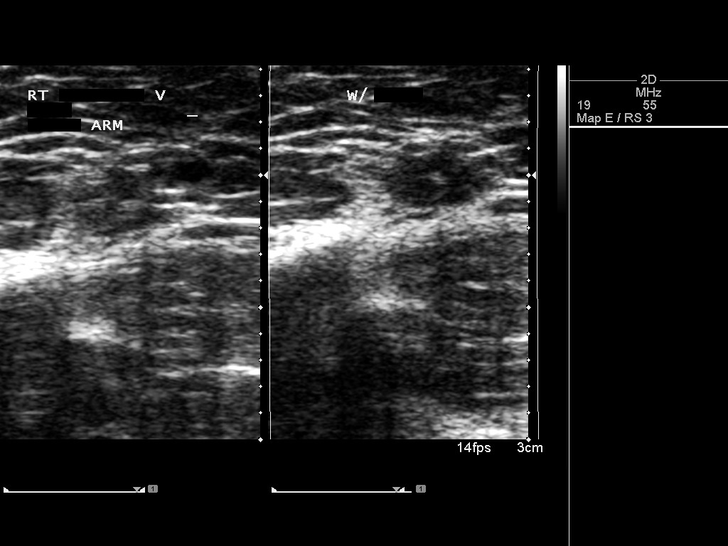
[im 26/29]
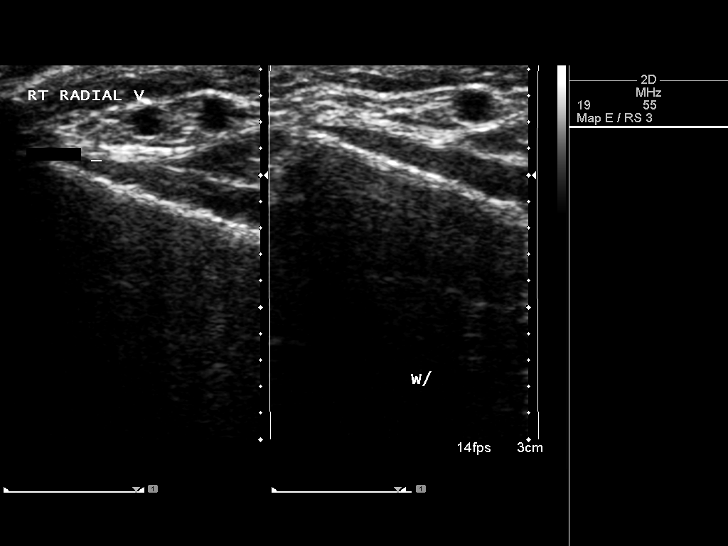
[im 29/29]
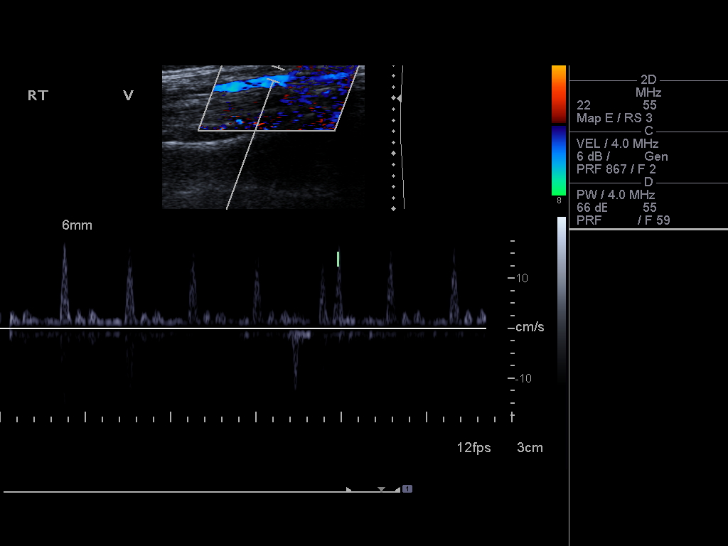

[13 of 24 positions shown; findings below may reference images not displayed]

FINDINGS: Thrombus within deep veins:  None visualized.

There is flow in the left subclavian vein. Normal compressibility,
color Doppler flow and augmentation in the right internal jugular
vein. Normal color Doppler flow, compressibility and augmentation in
the right subclavian vein. Normal compressibility, color Doppler
flow and augmentation in the right axillary vein. Normal
compressibility, color Doppler flow and augmentation in the right
brachial veins. Normal compressibility, color Doppler flow and
augmentation in the right basilic vein. Normal compressibility,
color Doppler flow and augmentation in the right cephalic vein.
Radial and ulnar veins are patent without thrombus.
IMPRESSION: Negative for deep vein thrombosis in the right upper extremity.

## 2017-11-27 ENCOUNTER — Ambulatory Visit (INDEPENDENT_AMBULATORY_CARE_PROVIDER_SITE_OTHER): Payer: 59 | Admitting: Internal Medicine

## 2017-11-27 ENCOUNTER — Encounter: Payer: Self-pay | Admitting: Internal Medicine

## 2017-11-27 VITALS — BP 104/76 | HR 78 | Temp 98.3°F | Wt 111.0 lb

## 2017-11-27 DIAGNOSIS — Z9109 Other allergy status, other than to drugs and biological substances: Secondary | ICD-10-CM | POA: Diagnosis not present

## 2017-11-27 MED ORDER — MOMETASONE FUROATE 50 MCG/ACT NA SUSP: 2.0000 | Freq: Every day | NASAL | 12 refills | Status: DC

## 2017-11-27 MED ORDER — METHYLPREDNISOLONE ACETATE 80 MG/ML IJ SUSP
80.0000 mg | Freq: Once | INTRAMUSCULAR | Status: AC
  Administered 2017-11-27: 80 mg via INTRAMUSCULAR

## 2017-11-27 NOTE — Addendum Note (Signed)
Addended by: Lurlean Nanny on: 11/27/2017 05:03 PM   Modules accepted: Orders

## 2017-11-27 NOTE — Progress Notes (Signed)
HPI  Pt presents to the clinic today with c/o ear fullness and nasal congestion. This started 1 week ago. She is blowing yellow mucous out of her nose. She denies ear pain or decreased hearing. She denies fever, chills or body aches. She has tried Claritin and Flonase with minimal relief. She has not had sick contacts. She does have issues with allergies.   Review of Systems     Past Medical History:  Diagnosis Date  . Bicornuate uterus 04/17/2017  . Dyspareunia in female   . Lipoma   . Lipoma of axilla 03/2016   bilateral    Family History  Problem Relation Age of Onset  . Hypertension Mother   . Cancer Neg Hx   . Diabetes Neg Hx   . Stroke Neg Hx     Social History   Socioeconomic History  . Marital status: Married    Spouse name: Not on file  . Number of children: Not on file  . Years of education: Not on file  . Highest education level: Not on file  Social Needs  . Financial resource strain: Not on file  . Food insecurity - worry: Not on file  . Food insecurity - inability: Not on file  . Transportation needs - medical: Not on file  . Transportation needs - non-medical: Not on file  Occupational History  . Not on file  Tobacco Use  . Smoking status: Never Smoker  . Smokeless tobacco: Never Used  Substance and Sexual Activity  . Alcohol use: No  . Drug use: No  . Sexual activity: Yes    Birth control/protection: Other-see comments    Comment: husband-vasectomy  Other Topics Concern  . Not on file  Social History Narrative  . Not on file    No Known Allergies   Constitutional: Denies headache, fatigue, fever or abrupt weight changes.  HEENT:  Positive nasal congestion. Denies eye redness, ear pain, ringing in the ears, wax buildup, runny nose or sore throat. Respiratory: Denies cough, difficulty breathing or shortness of breath.  Cardiovascular: Denies chest pain, chest tightness, palpitations or swelling in the hands or feet.   No other specific  complaints in a complete review of systems (except as listed in HPI above).  Objective:   BP 104/76   Pulse 78   Temp 98.3 F (36.8 C) (Oral)   Wt 111 lb (50.3 kg)   SpO2 98%   BMI 20.30 kg/m   General: Appears her stated age, well developed, well nourished in NAD. HEENT: Head: normal shape and size, no sinus tenderness noted; Ears: Tm's gray and intact, normal light reflex, moderate wax buidup; Nose: mucosa boggy and moist, turbinates swollen; Throat/Mouth: + PND. Teeth present, mucosa pink and moist, no exudate noted, no lesions or ulcerations noted.  Neck:  No adenopathy noted.  Cardiovascular: Normal rate and rhythm. S1,S2 noted.  No murmur, rubs or gallops noted.  Pulmonary/Chest: Normal effort and positive vesicular breath sounds. No respiratory distress. No wheezes, rales or ronchi noted.       Assessment & Plan:   Environmental Allergies:  Can use a Neti Pot which can be purchased from your local drug store. Stop Flonase, eRx for Nasonex to pharmacy Continue Claritin for now 80 mg Depo IM today Referral placed to allergist per patient request  RTC as needed or if symptoms persist. Webb Silversmith, NP

## 2017-11-27 NOTE — Patient Instructions (Signed)
Allergies An allergy is when your body reacts to a substance in a way that is not normal. An allergic reaction can happen after you:  Eat something.  Breathe in something.  Touch something.  You can be allergic to:  Things that are only around during certain seasons, like molds and pollens.  Foods.  Drugs.  Insects.  Animal dander.  What are the signs or symptoms?  Puffiness (swelling). This may happen on the lips, face, tongue, mouth, or throat.  Sneezing.  Coughing.  Breathing loudly (wheezing).  Stuffy nose.  Tingling in the mouth.  A rash.  Itching.  Itchy, red, puffy areas of skin (hives).  Watery eyes.  Throwing up (vomiting).  Watery poop (diarrhea).  Dizziness.  Feeling faint or fainting.  Trouble breathing or swallowing.  A tight feeling in the chest.  A fast heartbeat. How is this diagnosed? Allergies can be diagnosed with:  A medical and family history.  Skin tests.  Blood tests.  A food diary. A food diary is a record of all the foods, drinks, and symptoms you have each day.  The results of an elimination diet. This diet involves making sure not to eat certain foods and then seeing what happens when you start eating them again.  How is this treated? There is no cure for allergies, but allergic reactions can be treated with medicine. Severe reactions usually need to be treated at a hospital. How is this prevented? The best way to prevent an allergic reaction is to avoid the thing you are allergic to. Allergy shots and medicines can also help prevent reactions in some cases. This information is not intended to replace advice given to you by your health care provider. Make sure you discuss any questions you have with your health care provider. Document Released: 04/04/2013 Document Revised: 08/04/2016 Document Reviewed: 09/19/2014 Elsevier Interactive Patient Education  2018 [employer] Inc.  

## 2017-11-30 ENCOUNTER — Ambulatory Visit: Payer: 59 | Admitting: Internal Medicine

## 2017-12-04 ENCOUNTER — Ambulatory Visit: Payer: 59 | Admitting: Internal Medicine

## 2018-01-07 IMAGING — US US TRANSVAGINAL NON-OB
1 series · 14 of 25 positions shown · non-contrast
Comparison: None

CLINICAL DATA: Chronic pelvic pain



[Series 1: us transvaginal non-ob · 0.22mm/px · 14 of 125 slices shown]
[im 1/125]
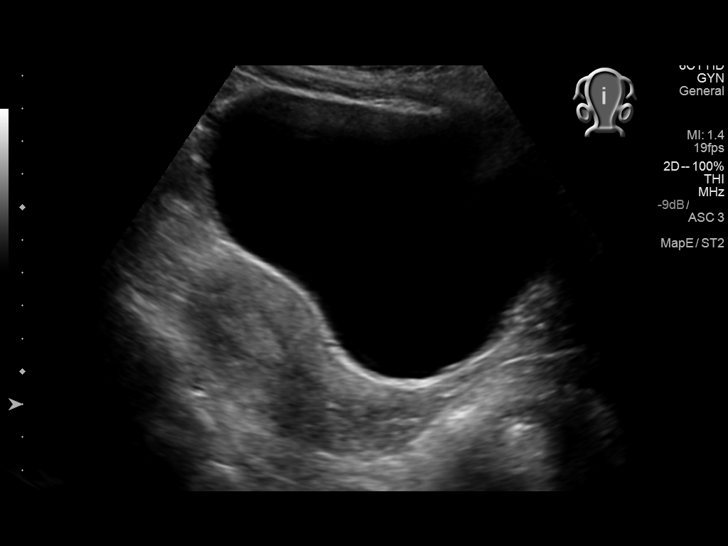
[im 11/125]
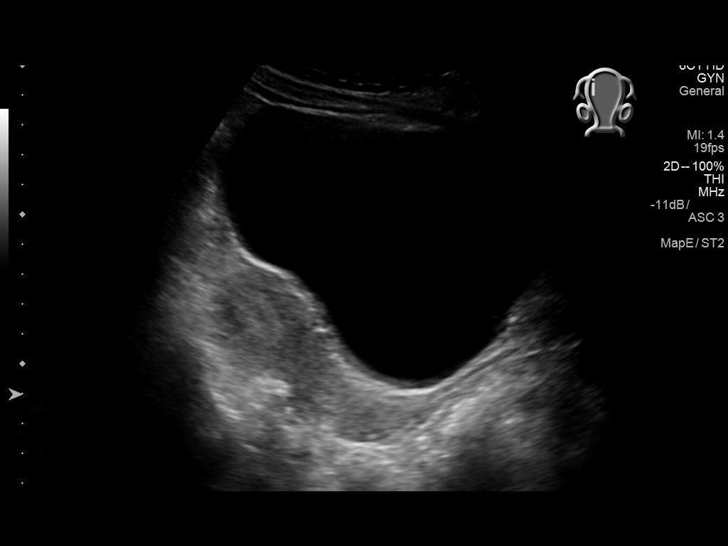
[im 21/125]
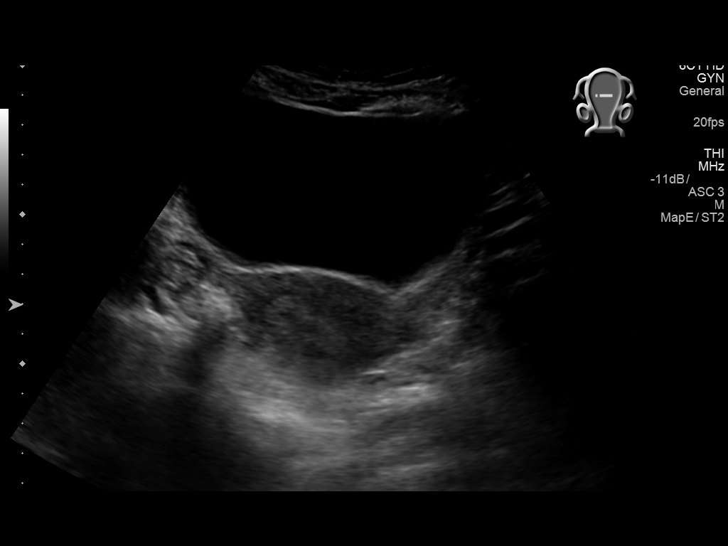
[im 32/125]
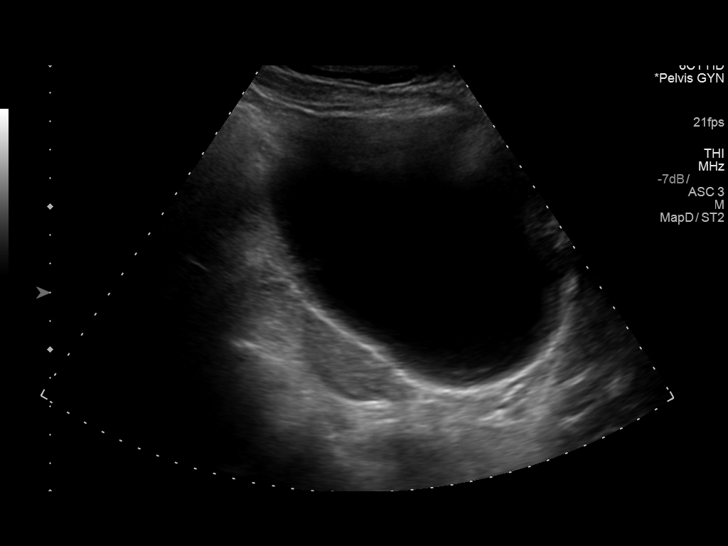
[im 42/125]
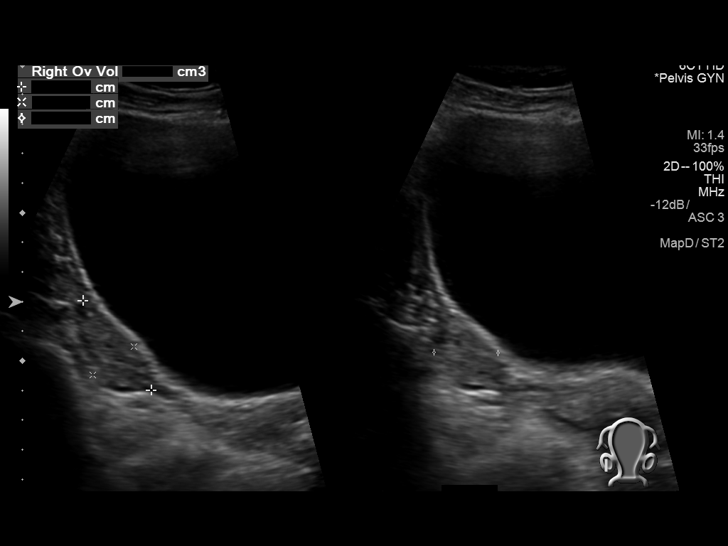
[im 47/125]
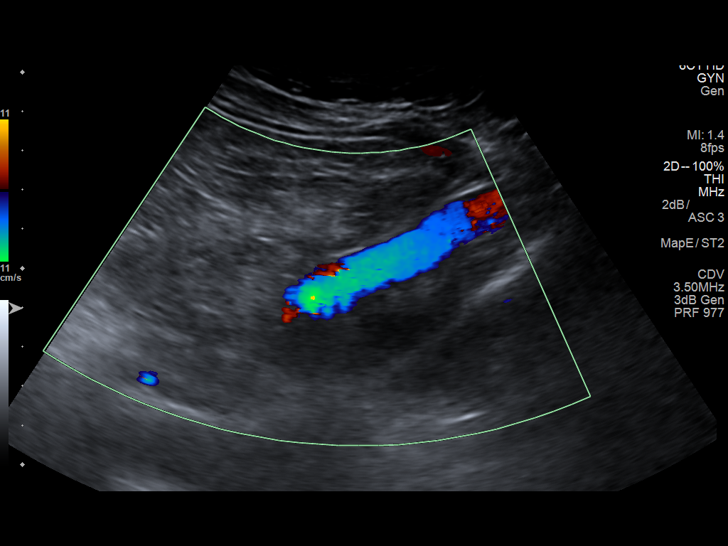
[im 57/125]
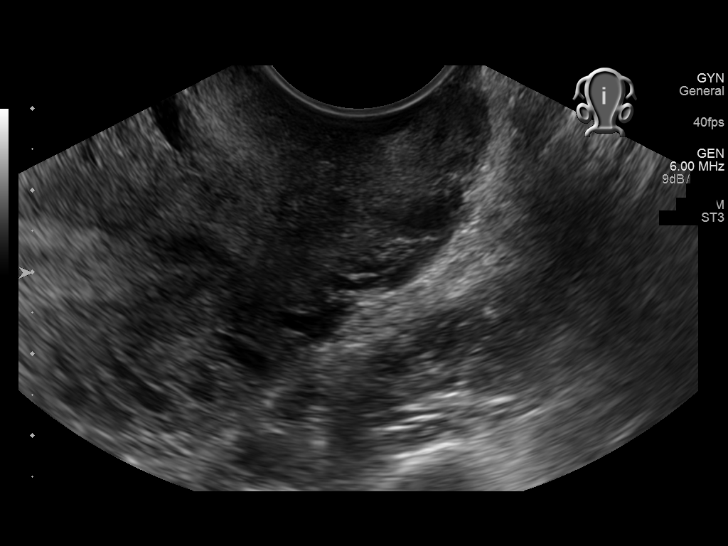
[im 68/125]
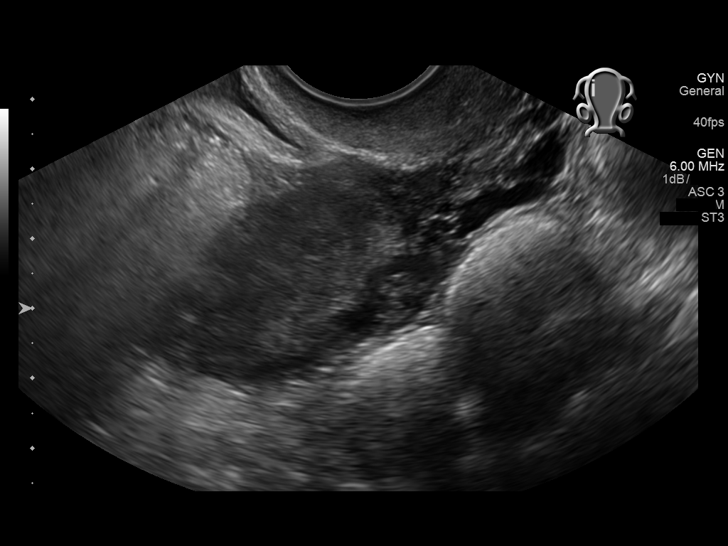
[im 78/125]
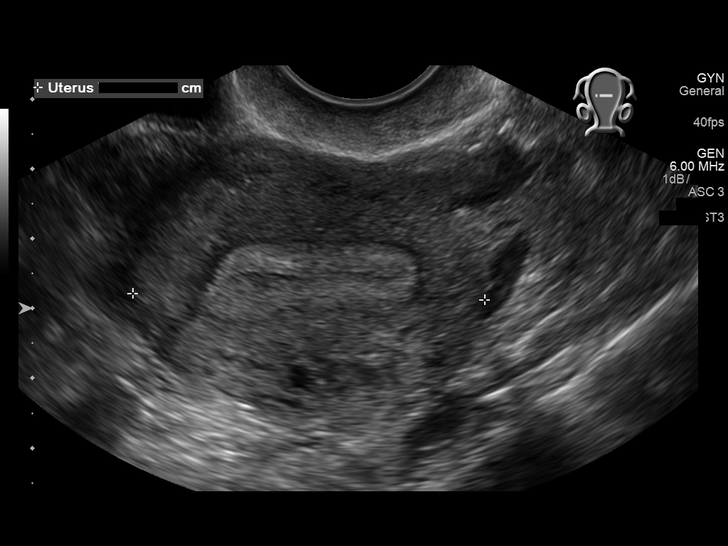
[im 83/125]
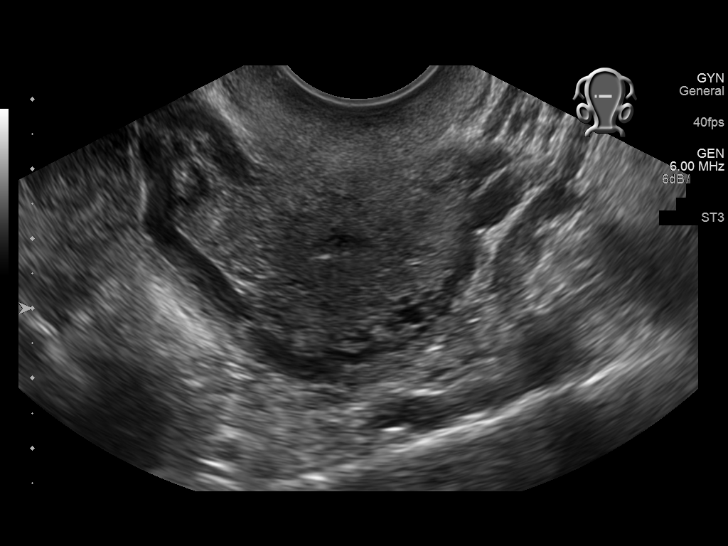
[im 94/125]
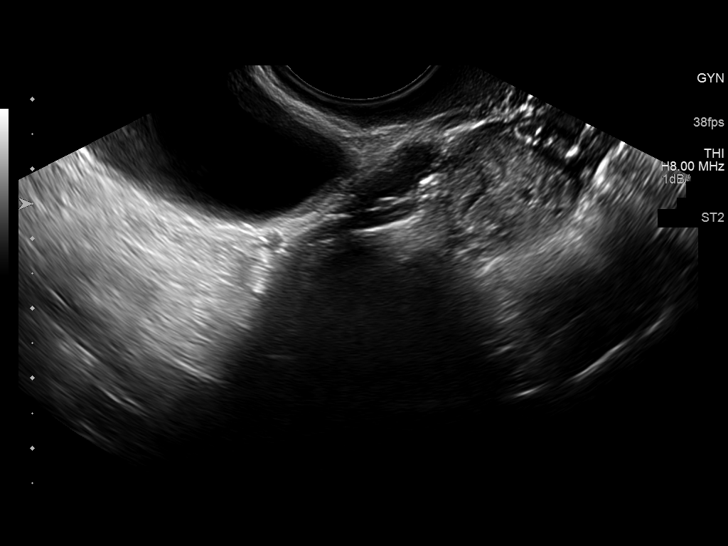
[im 104/125]
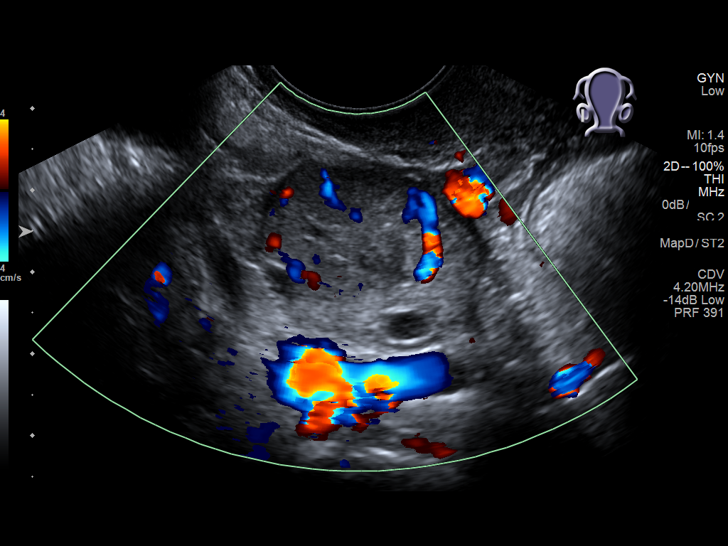
[im 114/125]
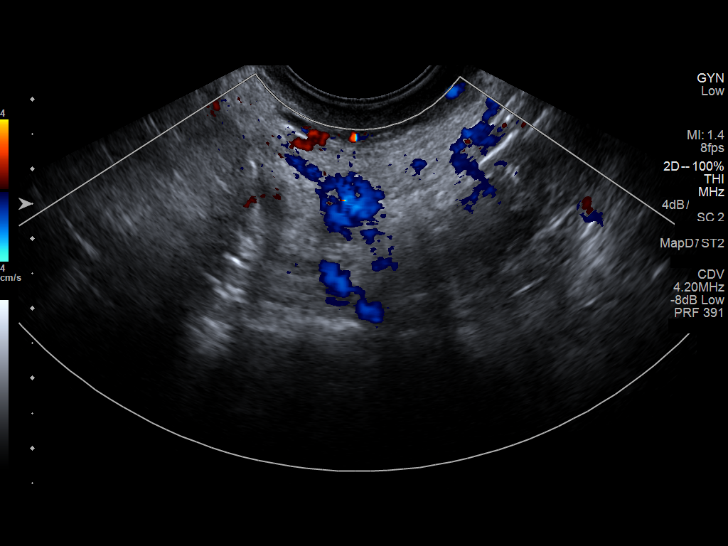
[im 125/125]
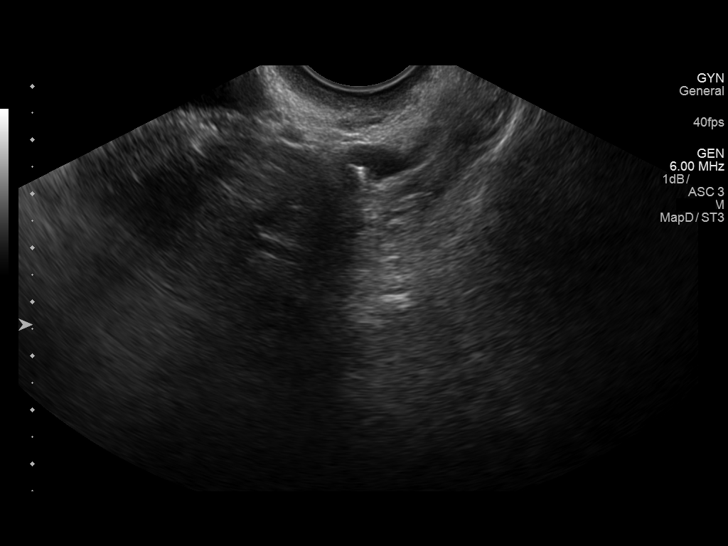

[14 of 25 positions shown; findings below may reference images not displayed]

FINDINGS: Uterus

Measurements: 7.9 x 3.6 x 5.2 cm. No fibroids or other mass
visualized. Possible bicornuate or septate anatomy, but not
definite.

Endometrium

Thickness: 10-11 is mm.  No focal abnormality visualized.

Right ovary

Measurements: 2.8 x 2.5 x 2.3 cm. Normal appearance/no adnexal mass.

Left ovary

Measurements: 2.6 x 1.9 x 2.4 cm. Normal appearance/no adnexal mass.

Other findings

No abnormal free fluid.
IMPRESSION: Possible bicornuate or septate uterine anatomy. Otherwise
unremarkable.

## 2018-02-14 ENCOUNTER — Ambulatory Visit
Admission: EM | Admit: 2018-02-14 | Discharge: 2018-02-14 | Disposition: A | Discharge status: Home / Self Care | Payer: No Typology Code available for payment source | Attending: Family Medicine | Admitting: Family Medicine

## 2018-02-14 DIAGNOSIS — J069 Acute upper respiratory infection, unspecified: Secondary | ICD-10-CM

## 2018-02-14 DIAGNOSIS — R05 Cough: Secondary | ICD-10-CM | POA: Diagnosis not present

## 2018-02-14 DIAGNOSIS — B9789 Other viral agents as the cause of diseases classified elsewhere: Secondary | ICD-10-CM

## 2018-02-14 MED ORDER — HYDROCOD POLST-CPM POLST ER 10-8 MG/5ML PO SUER: 5.0000 mL | Freq: Two times a day (BID) | ORAL | 0 refills | Status: DC | PRN

## 2018-02-14 MED ORDER — BENZONATATE 100 MG PO CAPS: 100.0000 mg | ORAL_CAPSULE | Freq: Three times a day (TID) | ORAL | 0 refills | Status: DC | PRN

## 2018-02-14 NOTE — ED Triage Notes (Signed)
Pt complains of productive cough for 1 week. No reports of fever, did take dayquil otc and said it helped her sleep at night.

## 2018-02-14 NOTE — Discharge Instructions (Signed)
Rest, fluids.  Use the tessalon for cough during the day. Tussionex at night.  Take care  Dr. Lacinda Axon

## 2018-02-14 NOTE — ED Provider Notes (Signed)
[facility]  CSN: [id number] Arrival date & time: 02/14/18  1300  History   Chief Complaint Chief Complaint  Patient presents with  . Cough   HPI  37 year old female presents with cough.  Patient reports a one-week history of cough.  Productive.  No fever.  She is taken some over-the-counter medication with some improvement.  Cough is severe.  She states that she has lost her voice as well.  No known exacerbating factors.  No other associated symptoms.  Of note, she had a recent sick contacts with similar symptoms.  No other complaints or concerns at this time.  Past Medical History:  Diagnosis Date  . Bicornuate uterus 04/17/2017  . Dyspareunia in female   . Lipoma   . Lipoma of axilla 03/2016   bilateral   Patient Active Problem List   Diagnosis Date Noted  . Bicornuate uterus 04/17/2017   Past Surgical History:  Procedure Laterality Date  . LIPOMA EXCISION Bilateral 04/22/2016   Procedure: EXCISION LIPOMA BILATERAL AXILLA ;  Surgeon: Erroll Luna, MD;  Location: [facility];  Service: General;  Laterality: Bilateral;  . NO PAST SURGERIES     OB History    Gravida Para Term Preterm AB Living   3 2 2   1 2    SAB TAB Ectopic Multiple Live Births   1       2     Home Medications    Prior to Admission medications   Medication Sig Start Date End Date Taking? Authorizing Provider  benzonatate (TESSALON) 100 MG capsule Take 1 capsule (100 mg total) by mouth 3 (three) times daily as needed. 02/14/18   Coral Spikes, DO  chlorpheniramine-HYDROcodone (TUSSIONEX PENNKINETIC ER) 10-8 MG/5ML SUER Take 5 mLs by mouth every 12 (twelve) hours as needed. 02/14/18   Coral Spikes, DO  fluticasone (FLONASE) 50 MCG/ACT nasal spray Place 2 sprays into both nostrils daily.    [provider]  loratadine (CLARITIN) 10 MG tablet Take 1 tablet (10 mg total) by mouth daily. 12/10/15   Ivar Drape D, PA  mometasone (NASONEX) 50 MCG/ACT nasal spray Place 2  sprays into the nose daily. 11/27/17   Jearld Fenton, NP   Family History Family History  Problem Relation Age of Onset  . Hypertension Mother   . Cancer Neg Hx   . Diabetes Neg Hx   . Stroke Neg Hx    Social History Social History   Tobacco Use  . Smoking status: Never Smoker  . Smokeless tobacco: Never Used  Substance Use Topics  . Alcohol use: No  . Drug use: No   Allergies   Patient has no known allergies.   Review of Systems Review of Systems  Constitutional: Negative.   HENT: Positive for voice change.   Respiratory: Positive for cough.    Physical Exam Triage Vital Signs ED Triage Vitals  Enc Vitals Group     BP 02/14/18 1330 114/84     Pulse Rate 02/14/18 1328 71     Resp 02/14/18 1328 18     Temp 02/14/18 1328 97.7 F (36.5 C)     Temp Source 02/14/18 1328 Oral     SpO2 02/14/18 1328 100 %     Weight 02/14/18 1328 111 lb (50.3 kg)     Height --      Head Circumference --      Peak Flow --      Pain Score 02/14/18 1328 7  Pain Loc --      Pain Edu? --      Excl. in [place]? --     Updated Vital Signs BP 114/84   Pulse 71   Temp 97.7 F (36.5 C) (Oral)   Resp 18   Wt 111 lb (50.3 kg)   LMP 01/15/2018 (Exact Date)   SpO2 100%   BMI 20.30 kg/m     Physical Exam  Constitutional: She is oriented to person, place, and time. She appears well-developed. No distress.  HENT:  Head: Normocephalic and atraumatic.  Mouth/Throat: Oropharynx is clear and moist.  Cardiovascular: Normal rate and regular rhythm.  Pulmonary/Chest: Effort normal and breath sounds normal. She has no wheezes. She has no rales.  Neurological: She is alert and oriented to person, place, and time.  Psychiatric: She has a normal mood and affect. Her behavior is normal.  Nursing note and vitals reviewed.  UC Treatments / Results  Labs (all labs ordered are listed, but only abnormal results are displayed) Labs Reviewed - No data to display  EKG  EKG Interpretation None         Radiology No results found.  Procedures Procedures (including critical care time)  Medications Ordered in UC Medications - No data to display   Initial Impression / Assessment and Plan / UC Course  I have reviewed the triage vital signs and the nursing notes.  Pertinent labs & imaging results that were available during my care of the patient were reviewed by me and considered in my medical decision making (see chart for details).     37 year old female presents with viral URI with cough.  Treating with Tessalon Perles and Tussionex.  Supportive care.  Final Clinical Impressions(s) / UC Diagnoses   Final diagnoses:  Viral URI with cough    ED Discharge Orders        Ordered    chlorpheniramine-HYDROcodone (TUSSIONEX PENNKINETIC ER) 10-8 MG/5ML SUER  Every 12 hours PRN     02/14/18 1400    benzonatate (TESSALON) 100 MG capsule  3 times daily PRN     02/14/18 1400     Controlled Substance Prescriptions [place] Controlled Substance Registry consulted? Not Applicable   Coral Spikes, DO 02/14/18 1429

## 2018-02-17 ENCOUNTER — Telehealth: Payer: Self-pay | Admitting: Emergency Medicine

## 2018-02-17 NOTE — Telephone Encounter (Signed)
Called to follow up after patient's recent visit. Spoke with Gerald Stabs and he states patient is better.

## 2018-03-18 ENCOUNTER — Ambulatory Visit (INDEPENDENT_AMBULATORY_CARE_PROVIDER_SITE_OTHER): Payer: No Typology Code available for payment source | Admitting: Internal Medicine

## 2018-03-18 ENCOUNTER — Encounter: Payer: Self-pay | Admitting: Internal Medicine

## 2018-03-18 VITALS — BP 100/66 | HR 82 | Temp 98.2°F | Wt 113.0 lb

## 2018-03-18 DIAGNOSIS — R0982 Postnasal drip: Secondary | ICD-10-CM

## 2018-03-18 DIAGNOSIS — J029 Acute pharyngitis, unspecified: Secondary | ICD-10-CM

## 2018-03-18 MED ORDER — METHYLPREDNISOLONE ACETATE 80 MG/ML IJ SUSP
80.0000 mg | Freq: Once | INTRAMUSCULAR | Status: AC
  Administered 2018-03-18: 80 mg via INTRAMUSCULAR

## 2018-03-18 NOTE — Progress Notes (Signed)
Subjective:    Patient ID: Haley Frazier, female    DOB: 1981/09/23, 37 y.o.   MRN: [id number]  HPI  Pt presents to the clinic today with c/o sore throat. She reports this started 1 week ago. She denies difficulty swallowing. She reports her throat feels dry. She denies runny nose, nasal congestion, ear pain or cough. She denies fever, chills or body aches. She has tried Singulair, Xyzal and Asteline as  Needed with some relief. She has not had sick contacts.  Review of Systems      Past Medical History:  Diagnosis Date  . Bicornuate uterus 04/17/2017  . Dyspareunia in female   . Lipoma   . Lipoma of axilla 03/2016   bilateral    Current Outpatient Medications  Medication Sig Dispense Refill  . azelastine (ASTELIN) 0.1 % nasal spray     . levocetirizine (XYZAL) 5 MG tablet     . montelukast (SINGULAIR) 10 MG tablet     . fluticasone (FLONASE) 50 MCG/ACT nasal spray Place 2 sprays into both nostrils daily.     No current facility-administered medications for this visit.     No Known Allergies  Family History  Problem Relation Age of Onset  . Hypertension Mother   . Cancer Neg Hx   . Diabetes Neg Hx   . Stroke Neg Hx     Social History   Socioeconomic History  . Marital status: Married    Spouse name: Not on file  . Number of children: Not on file  . Years of education: Not on file  . Highest education level: Not on file  Occupational History  . Not on file  Social Needs  . Financial resource strain: Not on file  . Food insecurity:    Worry: Not on file    Inability: Not on file  . Transportation needs:    Medical: Not on file    Non-medical: Not on file  Tobacco Use  . Smoking status: Never Smoker  . Smokeless tobacco: Never Used  Substance and Sexual Activity  . Alcohol use: No  . Drug use: No  . Sexual activity: Yes    Birth control/protection: Other-see comments    Comment: husband-vasectomy  Lifestyle  . Physical activity:    Days per week: Not  on file    Minutes per session: Not on file  . Stress: Not on file  Relationships  . Social connections:    Talks on phone: Not on file    Gets together: Not on file    Attends religious service: Not on file    Active member of club or organization: Not on file    Attends meetings of clubs or organizations: Not on file    Relationship status: Not on file  . Intimate partner violence:    Fear of current or ex partner: Not on file    Emotionally abused: Not on file    Physically abused: Not on file    Forced sexual activity: Not on file  Other Topics Concern  . Not on file  Social History Narrative  . Not on file     Constitutional: Denies fever, malaise, fatigue, headache or abrupt weight changes.  HEENT: Pt reports sore throat. Denies eye pain, eye redness, ear pain, ringing in the ears, wax buildup, runny nose, nasal congestion, bloody nose. Respiratory: Denies difficulty breathing, shortness of breath, cough or sputum production.    No other specific complaints in a complete review of systems (except  as listed in HPI above).  Objective:   Physical Exam  BP 100/66   Pulse 82   Temp 98.2 F (36.8 C) (Oral)   Wt 113 lb (51.3 kg)   SpO2 98%   BMI 20.67 kg/m  Wt Readings from Last 3 Encounters:  03/18/18 113 lb (51.3 kg)  02/14/18 111 lb (50.3 kg)  11/27/17 111 lb (50.3 kg)    General: Appears her stated age, well developed, well nourished in NAD. HEENT:  Throat/Mouth: Teeth present, mucosa erythematous and moist, + PND, no exudate, lesions or ulcerations noted.  Neck:  No adenopathy noted. Pulmonary/Chest: Normal effort and positive vesicular breath sounds. No respiratory distress. No wheezes, rales or ronchi noted.   BMET    Component Value Date/Time   NA 139 05/11/2017 0857   K 4.1 05/11/2017 0857   CL 101 05/11/2017 0857   CO2 25 05/11/2017 0857   GLUCOSE 97 05/11/2017 0857   GLUCOSE 92 07/31/2014 1128   BUN 17 05/11/2017 0857   CREATININE 0.59 05/11/2017  0857   CALCIUM 8.5 (L) 05/11/2017 0857   GFRNONAA 120 05/11/2017 0857   GFRAA 138 05/11/2017 0857    Lipid Panel     Component Value Date/Time   CHOL 140 05/11/2017 0857   TRIG 79 05/11/2017 0857   HDL 50 05/11/2017 0857   CHOLHDL 2.8 05/11/2017 0857   LDLCALC 74 05/11/2017 0857    CBC    Component Value Date/Time   WBC 7.3 05/11/2017 0857   WBC 7.7 07/31/2014 1128   RBC 4.62 05/11/2017 0857   RBC 4.98 07/31/2014 1128   HGB 13.4 05/11/2017 0857   HCT 42.0 05/11/2017 0857   PLT 241 05/11/2017 0857   MCV 91 05/11/2017 0857   MCH 29.0 05/11/2017 0857   MCH 30.1 07/31/2014 1128   MCHC 31.9 05/11/2017 0857   MCHC 33.5 07/31/2014 1128   RDW 13.0 05/11/2017 0857   LYMPHSABS 1.1 02/25/2012 0545   MONOABS 1.2 (H) 02/25/2012 0545   EOSABS 0.1 02/25/2012 0545   BASOSABS 0.0 02/25/2012 0545    Hgb A1C No results found for: HGBA1C          Assessment & Plan:   Sore Throat secondary to PND:  80 mg Depo IM today Take Singulair and Xyzal daily Follow up with allergy is needed  Return precautions discussed Webb Silversmith, NP

## 2018-03-18 NOTE — Patient Instructions (Signed)

## 2018-03-18 NOTE — Addendum Note (Signed)
Addended by: Lurlean Nanny on: 03/18/2018 05:02 PM   Modules accepted: Orders

## 2018-03-31 ENCOUNTER — Encounter: Payer: Self-pay | Admitting: Physician Assistant

## 2018-05-10 ENCOUNTER — Encounter: Payer: 59 | Admitting: Certified Nurse Midwife

## 2018-05-11 ENCOUNTER — Telehealth: Payer: Self-pay | Admitting: Internal Medicine

## 2018-05-11 NOTE — Telephone Encounter (Signed)
Patient is requesting a Referral to see ENT, please place Referral. The other Referral that was done was for Allergist in 11/2017

## 2018-05-12 ENCOUNTER — Encounter: Payer: No Typology Code available for payment source | Admitting: Internal Medicine

## 2018-05-12 NOTE — Telephone Encounter (Signed)
Pt has appt today 05/12/18 at 2:15 at [facility] with [facility]. When he made the appt he thought the allergist referral would work for the ENT as well he wasn't aware another referral would have to be placed.

## 2018-05-12 NOTE — Telephone Encounter (Signed)
I spoke with Chris(DPR signed) advised Gerald Stabs that since referral was placed at pts request in 11/2017. Pt would need appt with Webb Silversmith NP for eval to see if pt needs referral to ENT so recent office notes would accompany the referral. Offered appt for 05/13/18.Gerald Stabs voiced understanding and will need to ck with pt about availability for appt on 05/13/18. Gerald Stabs will cb after talking with pt to schedule appt with [facility]. Gerald Stabs said he would also cancel ENT appt today until can get referral. FYI to Avie Echevaria NP.

## 2018-05-12 NOTE — Telephone Encounter (Signed)
Why does she want to see ENT instead of allergy?

## 2018-05-12 NOTE — Telephone Encounter (Signed)
Pt's husband calling in checking on referral status. He states the allergist told her he couldn't do anything for her and she needs to see a ENT.

## 2018-05-12 NOTE — Telephone Encounter (Signed)
Haley Frazier said pt wants to see ENT because pt saw allergist and they could not help pt and was advised to see ENT.; this is info from [place] [facility] to [place].
